# Patient Record
Sex: Female | Born: 1950 | Race: White | Hispanic: No | Marital: Single | Smoking: Former smoker
Health system: Southern US, Community
[De-identification: ages and names within clinical notes are randomized; demographics above are authoritative.]

## PROBLEM LIST (undated history)

## (undated) DIAGNOSIS — I2510 Atherosclerotic heart disease of native coronary artery without angina pectoris: Secondary | ICD-10-CM

## (undated) DIAGNOSIS — F32A Depression, unspecified: Secondary | ICD-10-CM

## (undated) DIAGNOSIS — K219 Gastro-esophageal reflux disease without esophagitis: Secondary | ICD-10-CM

## (undated) DIAGNOSIS — I493 Ventricular premature depolarization: Secondary | ICD-10-CM

## (undated) DIAGNOSIS — E039 Hypothyroidism, unspecified: Secondary | ICD-10-CM

## (undated) DIAGNOSIS — I10 Essential (primary) hypertension: Secondary | ICD-10-CM

## (undated) DIAGNOSIS — Z8711 Personal history of peptic ulcer disease: Secondary | ICD-10-CM

## (undated) HISTORY — PX: TUBAL LIGATION: SHX77

## (undated) HISTORY — DX: Hypothyroidism, unspecified: E03.9

## (undated) HISTORY — DX: Atherosclerotic heart disease of native coronary artery without angina pectoris: I25.10

## (undated) HISTORY — PX: CHOLECYSTECTOMY: SHX55

## (undated) HISTORY — DX: Personal history of peptic ulcer disease: Z87.11

## (undated) HISTORY — DX: Ventricular premature depolarization: I49.3

## (undated) HISTORY — DX: Gastro-esophageal reflux disease without esophagitis: K21.9

---

## 2019-07-21 ENCOUNTER — Emergency Department (HOSPITAL_COMMUNITY)
Admission: EM | Admit: 2019-07-21 | Discharge: 2019-07-21 | Disposition: A | Discharge status: Home / Self Care | Payer: Medicare Other | Attending: Emergency Medicine | Admitting: Emergency Medicine

## 2019-07-21 ENCOUNTER — Emergency Department (HOSPITAL_COMMUNITY): Payer: Medicare Other

## 2019-07-21 ENCOUNTER — Other Ambulatory Visit: Payer: Self-pay

## 2019-07-21 ENCOUNTER — Encounter (HOSPITAL_COMMUNITY): Payer: Self-pay | Admitting: Emergency Medicine

## 2019-07-21 DIAGNOSIS — I10 Essential (primary) hypertension: Secondary | ICD-10-CM | POA: Diagnosis not present

## 2019-07-21 DIAGNOSIS — R531 Weakness: Secondary | ICD-10-CM | POA: Insufficient documentation

## 2019-07-21 DIAGNOSIS — R5383 Other fatigue: Secondary | ICD-10-CM | POA: Diagnosis not present

## 2019-07-21 DIAGNOSIS — R0789 Other chest pain: Secondary | ICD-10-CM | POA: Insufficient documentation

## 2019-07-21 HISTORY — DX: Essential (primary) hypertension: I10

## 2019-07-21 LAB — BASIC METABOLIC PANEL
Anion gap: 10 (ref 5–15)
BUN: 17 mg/dL (ref 8–23)
CO2: 26 mmol/L (ref 22–32)
Calcium: 9.6 mg/dL (ref 8.9–10.3)
Chloride: 105 mmol/L (ref 98–111)
Creatinine, Ser: 0.83 mg/dL (ref 0.44–1.00)
GFR calc Af Amer: >60 mL/min (ref >60)
GFR calc non Af Amer: >60 mL/min (ref >60)
Glucose, Bld: 132 mg/dL — ABNORMAL HIGH (ref 70–99)
Potassium: 4.2 mmol/L (ref 3.5–5.1)
Sodium: 141 mmol/L (ref 135–145)

## 2019-07-21 LAB — LIPASE, BLOOD: Lipase: 35 U/L (ref 11–51)

## 2019-07-21 LAB — HEPATIC FUNCTION PANEL
ALT: 23 U/L (ref 0–44)
AST: 25 U/L (ref 15–41)
Albumin: 4 g/dL (ref 3.5–5.0)
Alkaline Phosphatase: 76 U/L (ref 38–126)
Bilirubin, Direct: 0.1 mg/dL (ref 0.0–0.2)
Indirect Bilirubin: 0.8 mg/dL (ref 0.3–0.9)
Total Bilirubin: 0.9 mg/dL (ref 0.3–1.2)
Total Protein: 7.2 g/dL (ref 6.5–8.1)

## 2019-07-21 LAB — CBC
HCT: 48.2 % — ABNORMAL HIGH (ref 36.0–46.0)
Hemoglobin: 15.8 g/dL — ABNORMAL HIGH (ref 12.0–15.0)
MCH: 30 pg (ref 26.0–34.0)
MCHC: 32.8 g/dL (ref 30.0–36.0)
MCV: 91.5 fL (ref 80.0–100.0)
Platelets: 175 10*3/uL (ref 150–400)
RBC: 5.27 MIL/uL — ABNORMAL HIGH (ref 3.87–5.11)
RDW: 12.8 % (ref 11.5–15.5)
WBC: 5.2 10*3/uL (ref 4.0–10.5)
nRBC: 0 % (ref 0.0–0.2)

## 2019-07-21 LAB — URINALYSIS, ROUTINE W REFLEX MICROSCOPIC
Bilirubin Urine: NEGATIVE
Glucose, UA: NEGATIVE mg/dL
Hgb urine dipstick: NEGATIVE
Ketones, ur: 5 mg/dL — AB
Leukocytes,Ua: NEGATIVE
Nitrite: NEGATIVE
Protein, ur: NEGATIVE mg/dL
Specific Gravity, Urine: 1.027 (ref 1.005–1.030)
pH: 5 (ref 5.0–8.0)

## 2019-07-21 LAB — TROPONIN I (HIGH SENSITIVITY)
Troponin I (High Sensitivity): 5 ng/L (ref <18)
Troponin I (High Sensitivity): 5 ng/L (ref <18)

## 2019-07-21 MED ORDER — SODIUM CHLORIDE 0.9% FLUSH: 3.0000 mL | Freq: Once | INTRAVENOUS | Status: DC

## 2019-07-21 NOTE — ED Triage Notes (Signed)
Pt c/o weakness for "while". Also states has some chest pains/tihgtness and nervousness. Denies n/v/d.

## 2019-07-21 NOTE — ED Provider Notes (Addendum)
[facility]-EMERGENCY DEPT Provider Note   CSN: [id number] Arrival date & time: 07/21/19  1132     History Chief Complaint  Patient presents with  . Arm Pain  . Chest Pain  . Fatigue    Natalie Cross is a 69 y.o. female.  69 year old female presents with acute onset of weakness and fatigue which began today while she was walking.  States that she had some chest discomfort as well as for several minutes.  It was not associated dyspnea or diaphoresis.  No recent fever or chills.  No cough or congestion.  No vomiting or diarrhea.  Went home and went to sleep and woke up and still felt weak which seem to be worse with ambulation.  No chest pain or pressure at that time.  Denies any new medications.  No treatment use prior to arrival.  Denies any urinary symptoms        Past Medical History:  Diagnosis Date  . Hypertension     There are no problems to display for this patient.   Past Surgical History:  Procedure Laterality Date  . CHOLECYSTECTOMY    . TUBAL LIGATION       OB History   No obstetric history on file.     No family history on file.  Social History   Tobacco Use  . Smoking status: Not on file  Substance Use Topics  . Alcohol use: Not on file  . Drug use: Not on file    Home Medications Prior to Admission medications   Not on File    Allergies    Patient has no known allergies.  Review of Systems   Review of Systems  All other systems reviewed and are negative.   Physical Exam Updated Vital Signs BP (!) 184/86   Pulse 81   Temp 98.1 F (36.7 C)   Resp 18   SpO2 100%   Physical Exam Vitals and nursing note reviewed.  Constitutional:      General: She is not in acute distress.    Appearance: Normal appearance. She is well-developed. She is not toxic-appearing.  HENT:     Head: Normocephalic and atraumatic.  Eyes:     General: Lids are normal.     Conjunctiva/sclera: Conjunctivae normal.     Pupils:  Pupils are equal, round, and reactive to light.  Neck:     Thyroid: No thyroid mass.     Trachea: No tracheal deviation.  Cardiovascular:     Rate and Rhythm: Normal rate and regular rhythm.     Heart sounds: Normal heart sounds. No murmur. No gallop.   Pulmonary:     Effort: Pulmonary effort is normal. No respiratory distress.     Breath sounds: Normal breath sounds. No stridor. No decreased breath sounds, wheezing, rhonchi or rales.  Abdominal:     General: Bowel sounds are normal. There is no distension.     Palpations: Abdomen is soft.     Tenderness: There is no abdominal tenderness. There is no rebound.  Musculoskeletal:        General: No tenderness. Normal range of motion.     Cervical back: Normal range of motion and neck supple.  Skin:    General: Skin is warm and dry.     Findings: No abrasion or rash.  Neurological:     Mental Status: She is alert and oriented to person, place, and time.     GCS: GCS eye subscore is 4. GCS  verbal subscore is 5. GCS motor subscore is 6.     Cranial Nerves: No cranial nerve deficit.     Sensory: No sensory deficit.  Psychiatric:        Speech: Speech normal.        Behavior: Behavior normal.     ED Results / Procedures / Treatments   Labs (all labs ordered are listed, but only abnormal results are displayed) Labs Reviewed  BASIC METABOLIC PANEL - Abnormal; Notable for the following components:      Result Value   Glucose, Bld 132 (*)    All other components within normal limits  CBC - Abnormal; Notable for the following components:   RBC 5.27 (*)    Hemoglobin 15.8 (*)    HCT 48.2 (*)    All other components within normal limits  URINALYSIS, ROUTINE W REFLEX MICROSCOPIC  CBG MONITORING, ED  TROPONIN I (HIGH SENSITIVITY)    EKG EKG Interpretation  Date/Time:  Tuesday July 21 2019 18:44:57 EST Ventricular Rate:  83 PR Interval:    QRS Duration: 99 QT Interval:  390 QTC Calculation: 459 R Axis:   45 Text  Interpretation: Sinus rhythm No significant change since last tracing Confirmed by Lacretia Leigh ([id number]) on 07/21/2019 6:49:22 PM   Radiology DG Chest Portable 1 View  Result Date: 07/21/2019 CLINICAL DATA:  Chest pain. EXAM: PORTABLE CHEST 1 VIEW COMPARISON:  None. FINDINGS: There is no evidence of an acute infiltrate, pleural effusion or pneumothorax. The heart size and mediastinal contours are within normal limits. Multilevel degenerative changes seen throughout the thoracic spine. IMPRESSION: No active disease. Electronically Signed   By: Virgina Norfolk M.D.   On: 07/21/2019 18:43    Procedures Procedures (including critical care time)  Medications Ordered in ED Medications  sodium chloride flush (NS) 0.9 % injection 3 mL (has no administration in time range)    ED Course  I have reviewed the triage vital signs and the nursing notes.  Pertinent labs & imaging results that were available during my care of the patient were reviewed by me and considered in my medical decision making (see chart for details).    MDM Rules/Calculators/A&P                     Patient's heart score is 3. Patient had negative delta troponin here.  Chest x-ray is negative here.  No obvious explanation for her weakness.  She is at baseline at this time and will be discharged home Final Clinical Impression(s) / ED Diagnoses Final diagnoses:  None    Rx / DC Orders ED Discharge Orders    None       Lacretia Leigh, MD 07/21/19 2258    Lacretia Leigh, MD 07/21/19 [phone]

## 2020-06-17 ENCOUNTER — Other Ambulatory Visit: Payer: Self-pay | Admitting: Family Medicine

## 2020-06-17 DIAGNOSIS — E2839 Other primary ovarian failure: Secondary | ICD-10-CM

## 2020-06-17 DIAGNOSIS — Z1231 Encounter for screening mammogram for malignant neoplasm of breast: Secondary | ICD-10-CM

## 2020-07-02 ENCOUNTER — Emergency Department (HOSPITAL_BASED_OUTPATIENT_CLINIC_OR_DEPARTMENT_OTHER)
Admission: EM | Admit: 2020-07-02 | Discharge: 2020-07-02 | Disposition: A | Discharge status: Home / Self Care | Payer: Medicare Other | Attending: Emergency Medicine | Admitting: Emergency Medicine

## 2020-07-02 ENCOUNTER — Emergency Department (HOSPITAL_BASED_OUTPATIENT_CLINIC_OR_DEPARTMENT_OTHER): Payer: Medicare Other

## 2020-07-02 ENCOUNTER — Encounter (HOSPITAL_BASED_OUTPATIENT_CLINIC_OR_DEPARTMENT_OTHER): Payer: Self-pay | Admitting: Emergency Medicine

## 2020-07-02 DIAGNOSIS — M545 Low back pain, unspecified: Secondary | ICD-10-CM | POA: Diagnosis not present

## 2020-07-02 DIAGNOSIS — R109 Unspecified abdominal pain: Secondary | ICD-10-CM | POA: Insufficient documentation

## 2020-07-02 DIAGNOSIS — I10 Essential (primary) hypertension: Secondary | ICD-10-CM | POA: Insufficient documentation

## 2020-07-02 HISTORY — DX: Depression, unspecified: F32.A

## 2020-07-02 LAB — CBC WITH DIFFERENTIAL/PLATELET
Abs Immature Granulocytes: 0.01 10*3/uL (ref 0.00–0.07)
Basophils Absolute: 0 10*3/uL (ref 0.0–0.1)
Basophils Relative: 0 %
Eosinophils Absolute: 0 10*3/uL (ref 0.0–0.5)
Eosinophils Relative: 0 %
HCT: 45.8 % (ref 36.0–46.0)
Hemoglobin: 15.2 g/dL — ABNORMAL HIGH (ref 12.0–15.0)
Immature Granulocytes: 0 %
Lymphocytes Relative: 15 %
Lymphs Abs: 0.8 10*3/uL (ref 0.7–4.0)
MCH: 30 pg (ref 26.0–34.0)
MCHC: 33.2 g/dL (ref 30.0–36.0)
MCV: 90.5 fL (ref 80.0–100.0)
Monocytes Absolute: 0.3 10*3/uL (ref 0.1–1.0)
Monocytes Relative: 5 %
Neutro Abs: 4.1 10*3/uL (ref 1.7–7.7)
Neutrophils Relative %: 80 %
Platelets: 165 10*3/uL (ref 150–400)
RBC: 5.06 MIL/uL (ref 3.87–5.11)
RDW: 12.8 % (ref 11.5–15.5)
WBC: 5.1 10*3/uL (ref 4.0–10.5)
nRBC: 0 % (ref 0.0–0.2)

## 2020-07-02 LAB — COMPREHENSIVE METABOLIC PANEL
ALT: 17 U/L (ref 0–44)
AST: 22 U/L (ref 15–41)
Albumin: 4.3 g/dL (ref 3.5–5.0)
Alkaline Phosphatase: 62 U/L (ref 38–126)
Anion gap: 12 (ref 5–15)
BUN: 16 mg/dL (ref 8–23)
CO2: 25 mmol/L (ref 22–32)
Calcium: 9.3 mg/dL (ref 8.9–10.3)
Chloride: 103 mmol/L (ref 98–111)
Creatinine, Ser: 0.9 mg/dL (ref 0.44–1.00)
GFR, Estimated: >60 mL/min (ref >60)
Glucose, Bld: 124 mg/dL — ABNORMAL HIGH (ref 70–99)
Potassium: 3.5 mmol/L (ref 3.5–5.1)
Sodium: 140 mmol/L (ref 135–145)
Total Bilirubin: 0.6 mg/dL (ref 0.3–1.2)
Total Protein: 7 g/dL (ref 6.5–8.1)

## 2020-07-02 LAB — URINALYSIS, ROUTINE W REFLEX MICROSCOPIC
Bilirubin Urine: NEGATIVE
Glucose, UA: NEGATIVE mg/dL
Hgb urine dipstick: NEGATIVE
Ketones, ur: NEGATIVE mg/dL
Leukocytes,Ua: NEGATIVE
Nitrite: NEGATIVE
Protein, ur: NEGATIVE mg/dL
Specific Gravity, Urine: 1.02 (ref 1.005–1.030)
pH: 5 (ref 5.0–8.0)

## 2020-07-02 LAB — LIPASE, BLOOD: Lipase: 55 U/L — ABNORMAL HIGH (ref 11–51)

## 2020-07-02 MED ORDER — IOHEXOL 300 MG/ML  SOLN
100.0000 mL | Freq: Once | INTRAMUSCULAR | Status: AC | PRN
  Administered 2020-07-02: 100 mL via INTRAVENOUS

## 2020-07-02 MED ORDER — ONDANSETRON HCL 4 MG/2ML IJ SOLN
4.0000 mg | Freq: Once | INTRAMUSCULAR | Status: AC
  Administered 2020-07-02: 4 mg via INTRAVENOUS
  Filled 2020-07-02: qty 2

## 2020-07-02 MED ORDER — HYDROMORPHONE HCL 1 MG/ML IJ SOLN
1.0000 mg | Freq: Once | INTRAMUSCULAR | Status: AC
  Administered 2020-07-02: 1 mg via INTRAVENOUS
  Filled 2020-07-02: qty 1

## 2020-07-02 MED ORDER — DIAZEPAM 2 MG PO TABS
2.0000 mg | ORAL_TABLET | Freq: Once | ORAL | Status: AC
  Administered 2020-07-02: 2 mg via ORAL
  Filled 2020-07-02: qty 1

## 2020-07-02 MED ORDER — OXYCODONE-ACETAMINOPHEN 5-325 MG PO TABS: 1.0000 | ORAL_TABLET | Freq: Four times a day (QID) | ORAL | 0 refills | Status: DC | PRN

## 2020-07-02 MED ORDER — KETOROLAC TROMETHAMINE 30 MG/ML IJ SOLN
15.0000 mg | Freq: Once | INTRAMUSCULAR | Status: AC
  Administered 2020-07-02: 15 mg via INTRAVENOUS
  Filled 2020-07-02: qty 1

## 2020-07-02 MED ORDER — DIAZEPAM 2 MG PO TABS: 2.0000 mg | ORAL_TABLET | Freq: Three times a day (TID) | ORAL | 0 refills | Status: DC | PRN

## 2020-07-02 NOTE — ED Provider Notes (Signed)
[facility] EMERGENCY DEPARTMENT Provider Note   CSN: [id number] Arrival date & time: 07/02/20  1002     History Chief Complaint  Patient presents with  . Back Pain    Natalie Cross is a 70 y.o. female.  She is complaining of severe low back pain that started around 7 PM last night.  Occurred when she stood to get out of a chair.  No fall.  Associated with severe spasms.  Today she needed to move her bowels and she was afraid to get out of bed so she stooled herself.  Not associate with any numbness or weakness.  Called EMS and had to crawl to the door to open it.  She also states she has had digestive problems since she had gallstone pancreatitis 10 years ago.  She wonders if this pain in her back is related to her pancreatitis.  Still having a little bit of abdominal pain but more back pain.  She is currently not on any pain medicines.  History of back problems.  When the pain was severe she said she was diaphoretic.  The history is provided by the patient.  Back Pain Location:  Lumbar spine Quality:  Stabbing Pain severity:  Severe Pain is:  Same all the time Onset quality:  Sudden Duration:  16 hours Timing:  Constant Progression:  Unchanged Chronicity:  Recurrent Context: not falling and not recent injury   Relieved by:  Nothing Worsened by:  Bending, movement and twisting Ineffective treatments:  Being still and heating pad Associated symptoms: abdominal pain   Associated symptoms: no bladder incontinence, no bowel incontinence, no chest pain, no dysuria, no fever, no leg pain, no numbness and no weakness        Past Medical History:  Diagnosis Date  . Depression   . Hypertension     There are no problems to display for this patient.   Past Surgical History:  Procedure Laterality Date  . CHOLECYSTECTOMY    . TUBAL LIGATION       OB History   No obstetric history on file.     History reviewed. No pertinent family history.  Social History    Tobacco Use  . Smoking status: Never Smoker  . Smokeless tobacco: Never Used  Substance Use Topics  . Alcohol use: Not Currently    Home Medications Prior to Admission medications   Medication Sig Start Date End Date Taking? Authorizing Provider  Multiple Vitamin (MULTIVITAMIN) tablet Take 1 tablet by mouth daily.    [provider]    Allergies    Codeine  Review of Systems   Review of Systems  Constitutional: Positive for diaphoresis. Negative for fever.  HENT: Negative for sore throat.   Eyes: Negative for visual disturbance.  Respiratory: Negative for shortness of breath.   Cardiovascular: Negative for chest pain.  Gastrointestinal: Positive for abdominal pain. Negative for bowel incontinence and vomiting.  Genitourinary: Negative for bladder incontinence and dysuria.  Musculoskeletal: Positive for back pain and gait problem.  Skin: Negative for rash.  Neurological: Negative for weakness and numbness.    Physical Exam Updated Vital Signs BP (!) 164/81 (BP Location: Left Arm)   Pulse 76   Temp 98.4 F (36.9 C) (Oral)   Resp 18   Ht 5\' 6"  (1.676 m)   Wt 77.1 kg   SpO2 100%   BMI 27.44 kg/m   Physical Exam Vitals and nursing note reviewed.  Constitutional:      General: She is  not in acute distress.    Appearance: Normal appearance. She is well-developed and well-nourished.  HENT:     Head: Normocephalic and atraumatic.  Eyes:     Conjunctiva/sclera: Conjunctivae normal.  Cardiovascular:     Rate and Rhythm: Normal rate and regular rhythm.     Pulses: Normal pulses.     Heart sounds: No murmur heard.   Pulmonary:     Effort: Pulmonary effort is normal. No respiratory distress.     Breath sounds: Normal breath sounds.  Abdominal:     Palpations: Abdomen is soft.     Tenderness: There is no abdominal tenderness. There is no guarding or rebound.  Musculoskeletal:        General: Tenderness present. No edema.     Cervical back: Neck supple.      Comments: She is tender in her lumbar and paralumbar area.  Skin:    General: Skin is warm and dry.     Capillary Refill: Capillary refill takes less than 2 seconds.  Neurological:     General: No focal deficit present.     Mental Status: She is alert.     Sensory: No sensory deficit.     Motor: No weakness.     Gait: Gait abnormal (She was able to transfer from wheelchair to toilet with some assistance.  Pain seems to be limiting her more than true weakness.).  Psychiatric:        Mood and Affect: Mood and affect normal.     ED Results / Procedures / Treatments   Labs (all labs ordered are listed, but only abnormal results are displayed) Labs Reviewed  COMPREHENSIVE METABOLIC PANEL - Abnormal; Notable for the following components:      Result Value   Glucose, Bld 124 (*)    All other components within normal limits  LIPASE, BLOOD - Abnormal; Notable for the following components:   Lipase 55 (*)    All other components within normal limits  CBC WITH DIFFERENTIAL/PLATELET - Abnormal; Notable for the following components:   Hemoglobin 15.2 (*)    All other components within normal limits  URINALYSIS, ROUTINE W REFLEX MICROSCOPIC    EKG None  Radiology CT Abdomen Pelvis W Contrast  Result Date: 07/02/2020 CLINICAL DATA:  Back pain EXAM: CT ABDOMEN AND PELVIS WITH CONTRAST TECHNIQUE: Multidetector CT imaging of the abdomen and pelvis was performed using the standard protocol following bolus administration of intravenous contrast. CONTRAST:  161mL OMNIPAQUE IOHEXOL 300 MG/ML  SOLN COMPARISON:  None. FINDINGS: Lower chest: No acute abnormality. Hepatobiliary: Numerous hypodensities scattered throughout the liver, the largest in the left hepatic lobe measuring 2 cm most compatible with cysts. Prior cholecystectomy. Pancreas: No focal abnormality or ductal dilatation. Spleen: No focal abnormality.  Normal size. Adrenals/Urinary Tract: No adrenal abnormality. No focal renal  abnormality. No stones or hydronephrosis. Urinary bladder is unremarkable. Stomach/Bowel: Sigmoid diverticulosis. No active diverticulitis. Normal appendix. Stomach and small bowel decompressed, unremarkable. Vascular/Lymphatic: Aortic atherosclerosis. No evidence of aneurysm or adenopathy. Reproductive: Uterus and adnexa unremarkable.  No mass. Other: No free fluid or free air. Musculoskeletal: Mild degenerative disc and facet disease in the lower lumbar spine. No acute bony abnormality. IMPRESSION: No acute findings in the abdomen or pelvis. Sigmoid diverticulosis. Aortic atherosclerosis. Electronically Signed   By: Rolm Baptise M.D.   On: 07/02/2020 13:23   CT L-SPINE NO CHARGE  Result Date: 07/02/2020 CLINICAL DATA:  70 year old female with severe back pain since last night, diarrhea. EXAM: CT LUMBAR SPINE  WITH CONTRAST TECHNIQUE: Technique: Multiplanar CT images of the lumbar spine were reconstructed from contemporary CT of the Abdomen and Pelvis. CONTRAST:  No additional COMPARISON:  CT Chest, Abdomen, and Pelvis today are reported separately. FINDINGS: Segmentation: Normal. Alignment: Mild straightening of lumbar lordosis. Subtle dextroconvex lumbar scoliosis. No spondylolisthesis. Vertebrae: Osteopenia. No acute osseous abnormality identified. Intact visible sacrum and SI joints. Paraspinal and other soft tissues: Abdomen and pelvic viscera reported separately today. Negative paraspinal soft tissues. Disc levels: Capacious lower thoracic and lumbar spinal canal. T11-T12: Negative. T12-L1:  Negative. L1-L2:  Negative. L2-L3: Mild disc space loss, circumferential disc bulge. Mild endplate spurring. No significant stenosis. L3-L4: Mild circumferential disc bulge and endplate spurring. Borderline to mild facet hypertrophy. No convincing stenosis. L4-L5: Disc space loss. Circumferential disc bulge and endplate spurring eccentric to the right. Right greater than left mild to moderate facet hypertrophy. No  spinal stenosis. Mild right lateral recess stenosis is possible (right L5 nerve level). No convincing foraminal stenosis. L5-S1: Disc space loss. Mild circumferential disc bulge and endplate spurring. Mild to moderate facet hypertrophy greater on the right. No convincing stenosis. IMPRESSION: 1. Osteopenia. No acute osseous abnormality in the lumbar spine. 2. 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 With possible mild right lateral recess stenosis at L4-L5 (right L5 nerve level), but no spinal stenosis. 3. CT Abdomen and Pelvis today reported separately. Electronically Signed   By: Odessa Fleming M.D.   On: 07/02/2020 13:23    Procedures Procedures (including critical care time)  Medications Ordered in ED Medications  HYDROmorphone (DILAUDID) injection 1 mg (1 mg Intravenous Given 07/02/20 1102)  ondansetron (ZOFRAN) injection 4 mg (4 mg Intravenous Given 07/02/20 1101)  iohexol (OMNIPAQUE) 300 MG/ML solution 100 mL (100 mLs Intravenous Contrast Given 07/02/20 1256)  ketorolac (TORADOL) 30 MG/ML injection 15 mg (15 mg Intravenous Given 07/02/20 1405)  diazepam (VALIUM) tablet 2 mg (2 mg Oral Given 07/02/20 1405)    ED Course  I have reviewed the triage vital signs and the nursing notes.  Pertinent labs & imaging results that were available during my care of the patient were reviewed by me and considered in my medical decision making (see chart for details).  Clinical Course as of 07/02/20 1726  Sat Jul 02, 2020  1342 Patient's imaging of her abdomen and pelvis and her lumbar spine did not show any critical findings.  She says her pain is better after the medication.  We will try her on some oral spasm medicine and some Toradol now and see if she can mobilize. [MB]  1414 Patient ambulated in the department slowly but steadily. [MB]    Clinical Course User Index [MB] Terrilee Files, MD   MDM Rules/Calculators/A&P                         This patient complains of acute low back pain, more chronic  intermittent abdominal pain.; this involves an extensive number of treatment Options and is a complaint that carries with it a high risk of complications and Morbidity. The differential includes musculoskeletal pain, fracture, radiculopathy, pancreatitis, AAA, aortic dissection, gastric perforation, retroperitoneal bleed  I ordered, reviewed and interpreted labs, which included CBC with normal white count normal hemoglobin, chemistries normal other than mildly elevated glucose, urinalysis negative, lipase just mildly elevated and nonspecific I ordered medication IV pain medicine, oral diazepam for spasms I ordered imaging studies which included CT abdomen pelvis and CT lumbar spine and I independently  visualized and interpreted imaging which showed no acute findings. Does have some arthritis changes and some foraminal narrowing Previous records obtained and reviewed in epic, no recent visits  After the interventions stated above, I reevaluated the patient and found patient's pain to be adequately controlled and she is ambulatory in the department with much improvement. She is comfortable plan for outpatient follow-up with PCP and pain control. Return instructions discussed.   Final Clinical Impression(s) / ED Diagnoses Final diagnoses:  Low back pain    Rx / DC Orders ED Discharge Orders         Ordered    oxyCODONE-acetaminophen (PERCOCET/ROXICET) 5-325 MG tablet  Every 6 hours PRN        07/02/20 1421    diazepam (VALIUM) 2 MG tablet  Every 8 hours PRN        07/02/20 1421           Terrilee Files, MD 07/02/20 1728

## 2020-07-02 NOTE — ED Notes (Signed)
Pt on auto vs

## 2020-07-02 NOTE — ED Triage Notes (Signed)
Arrived via [facility], with c/o of lower back pain since last night. Hx of same, VS WNL, denies recent injury

## 2020-07-02 NOTE — Discharge Instructions (Addendum)
You were seen in the emergency department for low back pain and spasms.  You had blood work urinalysis CAT scan of your abdomen and pelvis and lumbar spine that did not show any serious findings.  Your lumbar spine did have some arthritis changes.  This will need to be followed up with your primary care doctor.  We are putting you on some pain medication and spasm medication to help with your symptoms.  Return to the emergency department if any high fever or other concerning symptoms

## 2020-07-09 ENCOUNTER — Ambulatory Visit: Payer: Medicare Other | Attending: Internal Medicine

## 2020-07-09 DIAGNOSIS — Z23 Encounter for immunization: Secondary | ICD-10-CM

## 2020-07-09 NOTE — Progress Notes (Signed)
   Covid-19 Vaccination Clinic  Name:  Natalie Cross    MRN: [id number] DOB: Apr 18, 1951  07/09/2020  [name] was observed post Covid-19 immunization for 15 minutes without incident. She was provided with Vaccine Information Sheet and instruction to access the V-Safe system.   [name] was instructed to call 911 with any severe reactions post vaccine: Marland Kitchen Difficulty breathing  . Swelling of face and throat  . A fast heartbeat  . A bad rash all over body  . Dizziness and weakness   Immunizations Administered    Name Date Dose VIS Date Route   Moderna Covid-19 Booster Vaccine 07/09/2020 10:55 AM 0.25 mL 04/20/2020 Intramuscular   Manufacturer: Moderna   Lot: [id number]   NDC: 32992-426-83

## 2020-09-27 ENCOUNTER — Ambulatory Visit
Admission: RE | Admit: 2020-09-27 | Discharge: 2020-09-27 | Disposition: A | Discharge status: Home / Self Care | Payer: Medicare (Managed Care) | Source: Ambulatory Visit | Attending: Family Medicine | Admitting: Family Medicine

## 2020-09-27 ENCOUNTER — Other Ambulatory Visit: Payer: Self-pay

## 2020-09-27 DIAGNOSIS — E2839 Other primary ovarian failure: Secondary | ICD-10-CM

## 2020-09-27 DIAGNOSIS — Z1231 Encounter for screening mammogram for malignant neoplasm of breast: Secondary | ICD-10-CM

## 2020-09-30 ENCOUNTER — Other Ambulatory Visit: Payer: Self-pay | Admitting: Family Medicine

## 2020-09-30 DIAGNOSIS — R928 Other abnormal and inconclusive findings on diagnostic imaging of breast: Secondary | ICD-10-CM

## 2020-10-21 ENCOUNTER — Other Ambulatory Visit: Payer: Self-pay

## 2020-10-21 ENCOUNTER — Ambulatory Visit
Admission: RE | Admit: 2020-10-21 | Discharge: 2020-10-21 | Disposition: A | Discharge status: Home / Self Care | Payer: Medicare (Managed Care) | Source: Ambulatory Visit | Attending: Family Medicine | Admitting: Family Medicine

## 2020-10-21 ENCOUNTER — Ambulatory Visit: Payer: Medicare (Managed Care)

## 2020-10-21 DIAGNOSIS — R928 Other abnormal and inconclusive findings on diagnostic imaging of breast: Secondary | ICD-10-CM

## 2021-08-01 ENCOUNTER — Other Ambulatory Visit (HOSPITAL_COMMUNITY): Payer: Self-pay | Admitting: Family Medicine

## 2021-08-11 ENCOUNTER — Ambulatory Visit (HOSPITAL_COMMUNITY)
Admission: RE | Admit: 2021-08-11 | Discharge: 2021-08-11 | Disposition: A | Discharge status: Home / Self Care | Payer: Medicare Other | Source: Ambulatory Visit | Attending: Family Medicine | Admitting: Family Medicine

## 2021-08-11 ENCOUNTER — Other Ambulatory Visit: Payer: Self-pay

## 2021-08-11 DIAGNOSIS — E7800 Pure hypercholesterolemia, unspecified: Secondary | ICD-10-CM | POA: Insufficient documentation

## 2021-08-11 DIAGNOSIS — Z136 Encounter for screening for cardiovascular disorders: Secondary | ICD-10-CM | POA: Insufficient documentation

## 2021-09-03 ENCOUNTER — Other Ambulatory Visit: Payer: Self-pay

## 2021-09-03 ENCOUNTER — Emergency Department (HOSPITAL_COMMUNITY): Payer: Medicare Other

## 2021-09-03 ENCOUNTER — Emergency Department (HOSPITAL_COMMUNITY)
Admission: EM | Admit: 2021-09-03 | Discharge: 2021-09-03 | Disposition: A | Discharge status: Home / Self Care | Payer: Medicare Other | Attending: Emergency Medicine | Admitting: Emergency Medicine

## 2021-09-03 ENCOUNTER — Encounter (HOSPITAL_COMMUNITY): Payer: Self-pay

## 2021-09-03 DIAGNOSIS — F419 Anxiety disorder, unspecified: Secondary | ICD-10-CM | POA: Insufficient documentation

## 2021-09-03 DIAGNOSIS — R5383 Other fatigue: Secondary | ICD-10-CM | POA: Diagnosis not present

## 2021-09-03 DIAGNOSIS — I10 Essential (primary) hypertension: Secondary | ICD-10-CM | POA: Diagnosis not present

## 2021-09-03 DIAGNOSIS — Z7982 Long term (current) use of aspirin: Secondary | ICD-10-CM | POA: Diagnosis not present

## 2021-09-03 DIAGNOSIS — R000 Tachycardia, unspecified: Secondary | ICD-10-CM | POA: Diagnosis not present

## 2021-09-03 DIAGNOSIS — R002 Palpitations: Secondary | ICD-10-CM | POA: Diagnosis not present

## 2021-09-03 LAB — CBC WITH DIFFERENTIAL/PLATELET
Abs Immature Granulocytes: 0.01 10*3/uL (ref 0.00–0.07)
Basophils Absolute: 0 10*3/uL (ref 0.0–0.1)
Basophils Relative: 1 %
Eosinophils Absolute: 0 10*3/uL (ref 0.0–0.5)
Eosinophils Relative: 0 %
HCT: 44.8 % (ref 36.0–46.0)
Hemoglobin: 14.9 g/dL (ref 12.0–15.0)
Immature Granulocytes: 0 %
Lymphocytes Relative: 30 %
Lymphs Abs: 1.4 10*3/uL (ref 0.7–4.0)
MCH: 30.3 pg (ref 26.0–34.0)
MCHC: 33.3 g/dL (ref 30.0–36.0)
MCV: 91.1 fL (ref 80.0–100.0)
Monocytes Absolute: 0.4 10*3/uL (ref 0.1–1.0)
Monocytes Relative: 8 %
Neutro Abs: 2.7 10*3/uL (ref 1.7–7.7)
Neutrophils Relative %: 61 %
Platelets: 157 10*3/uL (ref 150–400)
RBC: 4.92 MIL/uL (ref 3.87–5.11)
RDW: 12.9 % (ref 11.5–15.5)
WBC: 4.5 10*3/uL (ref 4.0–10.5)
nRBC: 0 % (ref 0.0–0.2)

## 2021-09-03 LAB — COMPREHENSIVE METABOLIC PANEL
ALT: 17 U/L (ref 0–44)
AST: 28 U/L (ref 15–41)
Albumin: 3.9 g/dL (ref 3.5–5.0)
Alkaline Phosphatase: 58 U/L (ref 38–126)
Anion gap: 10 (ref 5–15)
BUN: 10 mg/dL (ref 8–23)
CO2: 25 mmol/L (ref 22–32)
Calcium: 9.4 mg/dL (ref 8.9–10.3)
Chloride: 106 mmol/L (ref 98–111)
Creatinine, Ser: 0.93 mg/dL (ref 0.44–1.00)
GFR, Estimated: >60 mL/min (ref >60)
Glucose, Bld: 119 mg/dL — ABNORMAL HIGH (ref 70–99)
Potassium: 3.9 mmol/L (ref 3.5–5.1)
Sodium: 141 mmol/L (ref 135–145)
Total Bilirubin: 0.5 mg/dL (ref 0.3–1.2)
Total Protein: 6.9 g/dL (ref 6.5–8.1)

## 2021-09-03 LAB — TROPONIN I (HIGH SENSITIVITY): Troponin I (High Sensitivity): 4 ng/L (ref <18)

## 2021-09-03 LAB — T4, FREE: Free T4: 1.05 ng/dL (ref 0.61–1.12)

## 2021-09-03 LAB — MAGNESIUM: Magnesium: 2.2 mg/dL (ref 1.7–2.4)

## 2021-09-03 LAB — TSH: TSH: 7.323 u[IU]/mL — ABNORMAL HIGH (ref 0.350–4.500)

## 2021-09-03 MED ORDER — METOPROLOL SUCCINATE ER 25 MG PO TB24: 25.0000 mg | ORAL_TABLET | Freq: Every day | ORAL | 0 refills | Status: DC

## 2021-09-03 NOTE — Discharge Instructions (Signed)
Your EKG revealed premature ventricular contractions ?Your blood counts were great, your electrolytes were within normal range, your TSH (thyroid-stimulating hormone) was elevated.   ?You may follow-up with your primary care physician to continue the work-up for possible hypothyroidism. ?In the interim, we will give you a prescription for daily metoprolol to see if this helps with your intermittent palpitations. ?Continue staying hydrated, if your symptoms change or worsen please return to ED for evaluation  ?If you wish to follow-up with cardiology, please call [phone] to establish with [place] [facility]. ?

## 2021-09-03 NOTE — ED Provider Notes (Signed)
[facility] EM[facility]ENC[facility] DEPARTMENT Provider Note  History   Chief Complaint  Patient presents with   Tachycardia   Natalie Cross is a 71 y.o. female w/ h/o HTN, depression who p/w concern for intermittent palpitations.   The history is provided by the patient.  Illness Location:  Central chest Quality:  Palpitations (sensation of heart beating fast/skipping a beat) Severity:  Moderate Onset quality:  Sudden Duration: Usually brief, sustained for "a while" on Friday (2 days ago), unable to provide more specific details. Timing:  Intermittent Progression:  Worsening Chronicity:  Recurrent Context:  See MDM Associated symptoms: fatigue (Ongoing, chronic)   Associated symptoms: no abdominal pain, no chest pain, no congestion, no cough, no diarrhea, no fever, no headaches, no nausea, no rash, no shortness of breath, no sore throat and no vomiting    Past Medical History:  Diagnosis Date   Depression    Hypertension     Social History   Tobacco Use   Smoking status: Never   Smokeless tobacco: Never  Substance Use Topics   Alcohol use: Not Currently     History reviewed. No pertinent family history.  Review of Systems  Constitutional:  Positive for fatigue (Ongoing, chronic). Negative for appetite change, chills and fever.  HENT:  Negative for congestion and sore throat.   Eyes:  Negative for photophobia and visual disturbance.  Respiratory:  Negative for cough and shortness of breath.   Cardiovascular:  Negative for chest pain and palpitations.  Gastrointestinal:  Negative for abdominal pain, blood in stool, constipation, diarrhea, nausea and vomiting.  Endocrine: Negative.   Genitourinary:  Negative for difficulty urinating, dysuria and hematuria.  Musculoskeletal:  Negative for neck pain and neck stiffness.  Skin:  Negative for rash and wound.  Allergic/Immunologic: Negative.   Neurological:  Negative for dizziness, seizures, syncope, facial  asymmetry, speech difficulty, numbness and headaches.  Hematological: Negative.   Psychiatric/Behavioral: Negative.    All other systems reviewed and are negative.   Physical Exam   Today's Vitals   09/03/21 0930 09/03/21 1000 09/03/21 1031 09/03/21 1039  BP: (!) 155/77 139/83 (!) 172/81   Pulse: 71 70 76   Resp: 17 14 20    Temp:   98.6 F (37 C)   TempSrc:   Oral   SpO2: 96% 95% 98%   Weight:      Height:      PainSc:    0-No pain     Physical Exam Vitals reviewed.  Constitutional:      General: She is not in acute distress.    Appearance: Normal appearance. She is not ill-appearing or toxic-appearing.  HENT:     Head: Normocephalic and atraumatic.     Nose: Nose normal. No congestion.     Mouth/Throat:     Mouth: Mucous membranes are moist.     Pharynx: Oropharynx is clear. No oropharyngeal exudate.  Eyes:     Extraocular Movements: Extraocular movements intact.     Conjunctiva/sclera: Conjunctivae normal.     Pupils: Pupils are equal, round, and reactive to light.  Cardiovascular:     Rate and Rhythm: Normal rate and regular rhythm.     Pulses: Normal pulses.     Heart sounds: Normal heart sounds. No murmur heard. Pulmonary:     Effort: Pulmonary effort is normal. No respiratory distress.     Breath sounds: Normal breath sounds. No stridor. No wheezing, rhonchi or rales.  Chest:     Chest wall: No tenderness.  Abdominal:     General: Abdomen is flat.     Tenderness: There is no abdominal tenderness. There is no right CVA tenderness, left CVA tenderness, guarding or rebound.  Musculoskeletal:        General: Normal range of motion.     Cervical back: Normal range of motion and neck supple. No tenderness.     Right lower leg: No edema.     Left lower leg: No edema.  Skin:    General: Skin is warm and dry.     Capillary Refill: Capillary refill takes less than 2 seconds.     Findings: No rash.  Neurological:     General: No focal deficit present.     Mental  Status: She is alert and oriented to person, place, and time. Mental status is at baseline.     Cranial Nerves: No cranial nerve deficit.     Sensory: No sensory deficit.     Motor: No weakness.     Coordination: Coordination normal.  Psychiatric:     Comments: Anxious appearing, improving with reassurance at bedside.  Behavior normal.    ED Course  Procedures  Medical Decision Making:  Natalie Cross is a 71 y.o. female w/ h/o HTN, depression who p/w concern for intermittent palpitations.   Patient states for the last 2 months she has noticed intermittent palpitations (sensation of heart skipping a beat), worse in the evening when the patient is laying down.  Initially did not occur every evening, but over the last few days has occurred every night.  2 days ago, the patient was leaning forward to retrieve something from the fridge when she developed lightheaded sensation, she assisted herself to the floor, did not hit her head, did not fall, did not syncopized, and then noted the sensation of her heart skipping a beat, she states this was sustained and her heart rate "felt fast."  This is the only instance the patient reported of noticing this during the day.  She denies chest pain, shortness of breath, abdominal pain, GI losses, syncope, sustained lightheadedness or dizziness, trauma, sick contacts, dysuria, hematuria, blood in stool, or preceding infectious symptoms.  The patient reports regular p.o. intake.  This morning, the patient awoke feeling like her "heart was beating fast" and noted a "gurgling sensation in her stomach."  The patient states "I know that a heart attack can present differently in women, this is my primary concern and why called EMS."  She states now that she feels a little silly, may just be overreacting, knows that this causes her anxiety which "certainly makes things worse."  The patient states her only medications are lisinopril for hypertension and fluoxetine for  depression, and she has been compliant with these medications.  She states she has been evaluated for this concern with PCP, and has a follow-up PCP appointment for repeat labs but that "her anxiety this morning "made her feel like she needed to be evaluated today "just in case something was wrong."  Denies history of atrial fibrillation, atrial flutter, SVT, or history of ablation Denies anticoagulation Has never been seen by cardiology Has never been evaluated with a Holter monitor Denies recreational drug use (to include cocaine, EtOH, tobacco) Does not consume significant caffeine, occasionally drinks a cup of coffee Denies preceding infectious symptoms, denies fever  On examination: Vital signs stable, heart rate on the monitor reveals intermittent PVCs, lungs clear to auscultation, heart rate regular rate, regular rhythm with the exception of intermittent PVCs,  no murmur, no JVD, no pitting edema in lower extremities, abdomen soft/nontender, nonfocal neurologic examination.  To obtain CXR, EKG, basic labs to evaluate for possible causes of PVCs.  ER provider interpretation of Imaging / Radiology:  CXR: No acute cardiopulmonary abnormality  ER provider interpretation of EKG: Rate 78, QTc 441, PR 147, QRS 113, no STEMI, no ST depressions, comparison showed new PVCs.   ER provider interpretation of Labs:  CBC: WBC 4.5, Hgb 14.9 CMP: Na 141, K 3.9, BG 119, no AKI, no elevated LFTs Mg: 2.2 TSH: 7.323 Troponin: 4  Key medications administered in the ER:  Medications - No data to display  Diagnoses considered: Etiology of PVCs: Unknown at this time, may relate to thyroid dysfunction.  Differential includes narrow complex tachycardia (sinus tachycardia, atrial flutter, atrial fibrillation, multifocal atrial tachycardia), wide complex tachycardia (VT, aberrancy/LBBB), intermittent arrhythmias) PVCs, PACs, nonsustained VT), valvular heart disease (mitral/aortic insufficiency, mitral valve  prolapse), psychiatric (panic attack, anxiety, depression), drug/medication use (caffeine, amphetamines, cocaine, aspirin overdose, EtOH/opioid withdrawal), or other medical conditions (thyrotoxicosis, anemia, addisonian crisis, pheochromocytoma).  No concern for ACS as EKG unremarkable and troponin unremarkable.  Consulted: Not indicated  On reassessment, patient feels reassured and less anxious.  No new complaints at this time.  Patient updated of the above findings.  We will order free T4, patient will be discharged to follow-up with PCP for further work-up of her thyroid dysfunction.  In the interim, we will give her a short prescription for daily metoprolol as a trial to see if this will assist with palpitations.  All questions were answered at time of discharge.  Patient was discharged in stable condition.  Patient seen in conjunction with Dr. Duayne Cal medical dictation software was used in the creation of this note.   Electronically signed by: Wynetta Fines, MD on 09/03/2021 at 3:35 PM  Clinical Impression:  1. Palpitations     Dispo: Discharge    Wynetta Fines, MD 09/03/21 1538    Davonna Belling, MD 09/03/21 [phone]

## 2021-09-03 NOTE — ED Notes (Signed)
Patient transported to X-ray 

## 2021-09-03 NOTE — ED Triage Notes (Signed)
Pt BIB [facility] from home c/o feeling like she had a rapid HR. Per EMS she is having PVCs and is suppose to go see a doctor this week. Pt denies any pain at this time, but states she is scared.  ?

## 2021-09-20 ENCOUNTER — Ambulatory Visit: Payer: Medicare Other | Admitting: Cardiology

## 2021-09-20 ENCOUNTER — Encounter: Payer: Self-pay | Admitting: Cardiology

## 2021-09-20 ENCOUNTER — Inpatient Hospital Stay: Payer: Medicare Other

## 2021-09-20 ENCOUNTER — Other Ambulatory Visit: Payer: Self-pay

## 2021-09-20 VITALS — BP 130/76 | HR 72 | Temp 97.9°F | Resp 16 | Ht 65.0 in | Wt 175.0 lb

## 2021-09-20 DIAGNOSIS — I493 Ventricular premature depolarization: Secondary | ICD-10-CM

## 2021-09-20 DIAGNOSIS — R931 Abnormal findings on diagnostic imaging of heart and coronary circulation: Secondary | ICD-10-CM

## 2021-09-20 DIAGNOSIS — I10 Essential (primary) hypertension: Secondary | ICD-10-CM

## 2021-09-20 DIAGNOSIS — E039 Hypothyroidism, unspecified: Secondary | ICD-10-CM

## 2021-09-20 DIAGNOSIS — E7800 Pure hypercholesterolemia, unspecified: Secondary | ICD-10-CM

## 2021-09-20 DIAGNOSIS — I2510 Atherosclerotic heart disease of native coronary artery without angina pectoris: Secondary | ICD-10-CM

## 2021-09-20 MED ORDER — ATORVASTATIN CALCIUM 20 MG PO TABS
20.0000 mg | ORAL_TABLET | Freq: Every day | ORAL | 0 refills | Status: DC
Start: 2021-09-20 — End: 2021-12-18

## 2021-09-20 MED ORDER — ASPIRIN EC 81 MG PO TBEC
81.0000 mg | DELAYED_RELEASE_TABLET | Freq: Every day | ORAL | 11 refills | Status: DC
Start: 2021-09-20 — End: 2022-09-07

## 2021-09-20 NOTE — Progress Notes (Signed)
? ?Date:  09/20/2021  ? ?ID:  Natalie Cross, DOB 04-15-1951, MRN [id number] ? ?PCP:  Shon Hale, MD  ?Cardiologist:  Tessa Lerner, DO, [facility] (established care 09/20/2021) ? ?REASON FOR CONSULT: PVCs ? ?REQUESTING PHYSICIAN:  ?Shon Hale, MD ?[address] ?[place],  [place] [address] ? ?Chief Complaint  ?Patient presents with  ? Ventricular premature contractions  ? New Patient (Initial Visit)  ? ? ?HPI  ?Natalie Cross is a 71 y.o. Caucasian female whose past medical history and cardiovascular risk factors include: Benign essential hypertension, hyperlipidemia, Severe coronary artery calcification, hypothyroidism, history of stomach ulcers, PVCs, postmenopausal female, advanced age. ? ?She is referred to the office at the request of Shon Hale, * for evaluation of premature ventricular contractions. ? ?Patient presents to the office for evaluation and management of premature ventricular contractions/palpitations.  She recently had gone to [facility] emergency room department on September 03, 2020 due to a rapid heart rate via EMS.  She was found to have normal sinus rhythm with frequent PVCs and placed on metoprolol.  Her symptoms have improved since initiation of metoprolol.  She followed up with PCP and now referred to cardiology for further evaluation and management. ? ?Initially it was thought that the underlying cause was possibly her thyroid function; however, this was rechecked and her TSH is within normal limits. ? ?After initiation of metoprolol patient states that her symptoms of palpitations/skipped beats have improved by approximately 50%.  She is still noticeable when she is resting at night and when she thinks more about it anxiety kicks in and symptoms are exacerbated.  She denies any near-syncope or syncopal events.  She thinks it may be secondary to heartburn/GERD and also has started consuming small and frequent meals which has helped.  She is already on Protonix  daily. ? ?Patient very seldomly drinks caffeinated beverages such as tea and coffee.  She denies a consumption of alcohol, recreational drugs, energy drinks, stimulants, weight loss supplements. ? ?FUNCTIONAL STATUS: ?Goes out for a walk every other day for 1 hour. ? ?ALLERGIES: ?Allergies  ?Allergen Reactions  ? Codeine Nausea And Vomiting  ? ? ?MEDICATION LIST PRIOR TO VISIT: ?Current Meds  ?Medication Sig  ? acetaminophen (TYLENOL) 325 MG tablet Take 650 mg by mouth every 6 (six) hours as needed for moderate pain.  ? aspirin EC 81 MG tablet Take 1 tablet (81 mg total) by mouth daily. Swallow whole.  ? atorvastatin (LIPITOR) 20 MG tablet Take 1 tablet (20 mg total) by mouth at bedtime.  ? B Complex Vitamins (VITAMIN B COMPLEX) TABS Take 2 tablets by mouth daily.  ? Cholecalciferol (VITAMIN D3) 50 MCG (2000 UT) TABS Take 2,000 Units by mouth daily.  ? Echinacea 125 MG CAPS Take 125 mg by mouth daily.  ? FLUoxetine (PROZAC) 10 MG capsule Take 10 mg by mouth daily.  ? lisinopril (ZESTRIL) 20 MG tablet Take 20 mg by mouth daily.  ? metoprolol succinate (TOPROL-XL) 25 MG 24 hr tablet Take 1 tablet (25 mg total) by mouth daily for 14 days.  ? Misc Natural Products (GLUCOSAMINE CHOND DOUBLE STR) TABS Take 2 tablets by mouth daily.  ? Multiple Vitamins-Minerals (HAIR SKIN AND NAILS FORMULA) TABS Take 2 tablets by mouth daily.  ? Omega 3 1200 MG CAPS Take 1,200 mg by mouth daily.  ? pantoprazole (PROTONIX) 40 MG tablet Take 40 mg by mouth daily as needed (indigestion, heartburn).  ?  ? ?PAST MEDICAL HISTORY: ?Past Medical  History:  ?Diagnosis Date  ? Depression   ? GERD (gastroesophageal reflux disease)   ? History of stomach ulcers   ? Hypertension   ? Hypothyroidism   ? Premature ventricular contractions   ? ? ?PAST SURGICAL HISTORY: ?Past Surgical History:  ?Procedure Laterality Date  ? CHOLECYSTECTOMY    ? TUBAL LIGATION    ? ? ?FAMILY HISTORY: ?The patient family history includes Stroke in her father. ? ?SOCIAL  HISTORY:  ?The patient  reports that she has never smoked. She has never used smokeless tobacco. She reports that she does not currently use alcohol. She reports that she does not use drugs. ? ?REVIEW OF SYSTEMS: ?Review of Systems  ?Cardiovascular:  Positive for palpitations. Negative for chest pain, claudication, dyspnea on exertion, leg swelling, near-syncope, orthopnea, paroxysmal nocturnal dyspnea and syncope.  ?Respiratory:  Negative for shortness of breath.   ?Musculoskeletal:  Positive for arthritis and back pain.  ? ?PHYSICAL EXAM: ? ?  09/20/2021  ? 11:19 AM 09/20/2021  ? 11:13 AM 09/03/2021  ? 10:31 AM  ?Vitals with BMI  ?Height  5\' 5"    ?Weight  175 lbs   ?BMI  29.12   ?Systolic 130 150 [id number]  ?Diastolic 76 82 81  ?Pulse 72 66 76  ? ? ?CONSTITUTIONAL: Well-developed and well-nourished. No acute distress.  ?SKIN: Skin is warm and dry. No rash noted. No cyanosis. No pallor. No jaundice ?HEAD: Normocephalic and atraumatic.  ?EYES: No scleral icterus ?MOUTH/THROAT: Moist oral membranes.  ?NECK: No JVD present. No thyromegaly noted. No carotid bruits  ?LYMPHATIC: No visible cervical adenopathy.  ?CHEST Normal respiratory effort. No intercostal retractions  ?LUNGS: Clear to auscultation bilaterally.  No stridor. No wheezes. No rales.  ?CARDIOVASCULAR: Regular rate and rhythm, positive S1-S2, no murmurs rubs or gallops appreciated. ?ABDOMINAL: Soft, nontender, nondistended, positive bowel sounds in all 4 quadrants no apparent ascites.  ?EXTREMITIES: No peripheral edema, warm to touch, faint bilateral DP and PT pulses ?HEMATOLOGIC: No significant bruising ?NEUROLOGIC: Oriented to person, place, and time. Nonfocal. Normal muscle tone.  ?PSYCHIATRIC: Normal mood and affect. Normal behavior. Cooperative ? ?CARDIAC DATABASE: ?EKG: ?09/03/2021: NSR, 78 bpm, incomplete right bundle branch block, occasional PVCs. ?09/20/2021: NSR, 72 bpm, without underlying ischemia or injury pattern, rare PVC.  ? ?Echocardiogram: ?No results  found for this or any previous visit from the past 1095 days. ?  ? ?Stress Testing: ?No results found for this or any previous visit from the past 1095 days. ? ? ?Heart Catheterization: ?None ? ?Calcium Score  ?08/11/2021: ?LM 0 ?RCA 0 ?LAD 198 ?LCX 310 ?Total: 508 ?IMPRESSION: ?Coronary calcium score of 508. This was 4791 st percentile for age and sex matched control. ? ?LABORATORY DATA: ? ?  Latest Ref Rng & Units 09/03/2021  ?  7:41 AM 07/02/2020  ? 10:57 AM 07/21/2019  ? 12:41 PM  ?CBC  ?WBC 4.0 - 10.5 K/uL 4.5   5.1   5.2    ?Hemoglobin 12.0 - 15.0 g/dL 29.514.9   28.415.2   13.215.8    ?Hematocrit 36.0 - 46.0 % 44.8   45.8   48.2    ?Platelets 150 - 400 K/uL 157   165   175    ? ? ? ?  Latest Ref Rng & Units 09/03/2021  ?  7:41 AM 07/02/2020  ? 10:57 AM 07/21/2019  ? 12:41 PM  ?CMP  ?Glucose 70 - 99 mg/dL 440119   102124   725132    ?BUN 8 - 23 mg/dL  10   16   17     ?Creatinine 0.44 - 1.00 mg/dL   3.55   7.32    ?Sodium 135 - 145 mmol/L 141   140   141    ?Potassium 3.5 - 5.1 mmol/L 3.9   3.5   4.2    ?Chloride 98 - 111 mmol/L 106   103   105    ?CO2 22 - 32 mmol/L 25   25   26     ?Calcium 8.9 - 10.3 mg/dL 9.4   9.3   9.6    ?Total Protein 6.5 - 8.1 g/dL 6.9   7.0   7.2    ?Total Bilirubin 0.3 - 1.2 mg/dL 0.5   0.6   0.9    ?Alkaline Phos 38 - 126 U/L 58   62   76    ?AST 15 - 41 U/L 28   22   25     ?ALT 0 - 44 U/L 17   17   23     ? ? ?Lipid Panel  ?No results found for: CHOL, TRIG, HDL, CHOLHDL, VLDL, LDLCALC, LDLDIRECT, LABVLDL ? ?No components found for: NTPROBNP ?No results for input(s): PROBNP in the last 8760 hours. ?Recent Labs  ?  09/03/21 ?0741  ?TSH 7.323*  ? ? ?BMP ?Recent Labs  ?  09/03/21 ?0741  ?NA 141  ?K 3.9  ?CL 106  ?CO2 25  ?GLUCOSE 119*  ?BUN 10  ?CREATININE 0.93  ?CALCIUM 9.4  ?GFRNONAA >60  ? ? ?HEMOGLOBIN A1C ?No results found for: HGBA1C, MPG ? ?External Labs: ?Collected: 09/08/2021. ?Hemoglobin 14.1 g/dL, hematocrit . ?TSH 2.09. ? ?External Labs: ?Collected: 07/25/2021. ?Hemoglobin A1c 5.7 BUN 16, creatinine  0.86. ?Sodium 141, potassium 3.8, chloride 107, bicarb 29 ?AST 17, ALT 13, alkaline phosphatase 64. ?Total cholesterol 231, triglycerides 183, HDL 50, LDL 148, non-HDL 181 ? ? ?IMPRESSION: ? ?  ICD-10-CM   ?1.

## 2021-09-27 ENCOUNTER — Ambulatory Visit: Payer: Medicare Other

## 2021-09-27 DIAGNOSIS — R931 Abnormal findings on diagnostic imaging of heart and coronary circulation: Secondary | ICD-10-CM

## 2021-09-27 DIAGNOSIS — I2510 Atherosclerotic heart disease of native coronary artery without angina pectoris: Secondary | ICD-10-CM

## 2021-10-02 ENCOUNTER — Other Ambulatory Visit: Payer: Self-pay

## 2021-10-02 DIAGNOSIS — I2510 Atherosclerotic heart disease of native coronary artery without angina pectoris: Secondary | ICD-10-CM

## 2021-10-02 DIAGNOSIS — R931 Abnormal findings on diagnostic imaging of heart and coronary circulation: Secondary | ICD-10-CM

## 2021-10-02 DIAGNOSIS — E7800 Pure hypercholesterolemia, unspecified: Secondary | ICD-10-CM

## 2021-10-05 NOTE — Progress Notes (Signed)
Called pt, no answer. Left VM requesting call back.

## 2021-10-05 NOTE — Progress Notes (Signed)
Pt called back. Pt aware.

## 2021-10-16 ENCOUNTER — Ambulatory Visit: Payer: Medicare Other

## 2021-10-16 DIAGNOSIS — R931 Abnormal findings on diagnostic imaging of heart and coronary circulation: Secondary | ICD-10-CM

## 2021-10-16 DIAGNOSIS — I2510 Atherosclerotic heart disease of native coronary artery without angina pectoris: Secondary | ICD-10-CM

## 2021-10-20 NOTE — Progress Notes (Signed)
Tried calling patient no answer left a vm

## 2021-11-01 LAB — CMP14+EGFR
ALT: 22 IU/L (ref 0–32)
AST: 19 IU/L (ref 0–40)
Albumin/Globulin Ratio: 2 (ref 1.2–2.2)
Albumin: 4.5 g/dL (ref 3.7–4.7)
Alkaline Phosphatase: 89 IU/L (ref 44–121)
BUN/Creatinine Ratio: 15 (ref 12–28)
BUN: 14 mg/dL (ref 8–27)
Bilirubin Total: 0.5 mg/dL (ref 0.0–1.2)
CO2: 24 mmol/L (ref 20–29)
Calcium: 9.5 mg/dL (ref 8.7–10.3)
Chloride: 107 mmol/L — ABNORMAL HIGH (ref 96–106)
Creatinine, Ser: 0.94 mg/dL (ref 0.57–1.00)
Globulin, Total: 2.2 g/dL (ref 1.5–4.5)
Glucose: 96 mg/dL (ref 70–99)
Potassium: 4 mmol/L (ref 3.5–5.2)
Sodium: 147 mmol/L — ABNORMAL HIGH (ref 134–144)
Total Protein: 6.7 g/dL (ref 6.0–8.5)
eGFR: 65 mL/min/{1.73_m2} (ref >59)

## 2021-11-01 LAB — LDL CHOLESTEROL, DIRECT: LDL Direct: 77 mg/dL (ref 0–99)

## 2021-11-01 LAB — LIPID PANEL WITH LDL/HDL RATIO
Cholesterol, Total: 145 mg/dL (ref 100–199)
HDL: 59 mg/dL (ref >39)
LDL Chol Calc (NIH): 71 mg/dL (ref 0–99)
LDL/HDL Ratio: 1.2 ratio (ref 0.0–3.2)
Triglycerides: 76 mg/dL (ref 0–149)
VLDL Cholesterol Cal: 15 mg/dL (ref 5–40)

## 2021-11-01 LAB — LIPOPROTEIN A (LPA): Lipoprotein (a): 318.9 nmol/L — ABNORMAL HIGH (ref <75.0)

## 2021-11-03 NOTE — Progress Notes (Signed)
Called pt to inform her about her lab results. Pt understood

## 2021-11-09 ENCOUNTER — Ambulatory Visit: Payer: Medicare Other | Admitting: Cardiology

## 2021-11-09 ENCOUNTER — Encounter: Payer: Self-pay | Admitting: Cardiology

## 2021-11-09 VITALS — BP 122/73 | HR 73 | Temp 97.3°F | Resp 17 | Ht 65.0 in | Wt 174.4 lb

## 2021-11-09 DIAGNOSIS — R931 Abnormal findings on diagnostic imaging of heart and coronary circulation: Secondary | ICD-10-CM

## 2021-11-09 DIAGNOSIS — I2510 Atherosclerotic heart disease of native coronary artery without angina pectoris: Secondary | ICD-10-CM

## 2021-11-09 DIAGNOSIS — E039 Hypothyroidism, unspecified: Secondary | ICD-10-CM

## 2021-11-09 DIAGNOSIS — E7800 Pure hypercholesterolemia, unspecified: Secondary | ICD-10-CM

## 2021-11-09 DIAGNOSIS — I10 Essential (primary) hypertension: Secondary | ICD-10-CM

## 2021-11-09 DIAGNOSIS — I493 Ventricular premature depolarization: Secondary | ICD-10-CM

## 2021-11-09 MED ORDER — DILTIAZEM HCL ER COATED BEADS 120 MG PO CP24: 120.0000 mg | ORAL_CAPSULE | Freq: Every day | ORAL | 0 refills | Status: DC

## 2021-11-09 NOTE — Progress Notes (Signed)
? ?Date:  11/09/2021  ? ?ID:  Karlton Lemon Tunison, DOB Nov 17, 1950, MRN [id number] ? ?PCP:  Shon Hale, MD  ?Cardiologist:  Tessa Lerner, DO, [facility] (established care 09/20/2021) ? ?Date: 11/09/21 ?Last Office Visit: 09/20/2021 ? ?Chief Complaint  ?Patient presents with  ? Follow-up  ?  7 WEEKS  ? Palpitations  ? HLD  ? Results  ? ? ?HPI  ?Aren Cherne Daponte is a 71 y.o. Caucasian female whose past medical history and cardiovascular risk factors include: Benign essential hypertension, hyperlipidemia, Severe coronary artery calcification, hypothyroidism, history of stomach ulcers, PVCs, postmenopausal female, advanced age. ? ?She is referred to the office at the request of Shon Hale, * for evaluation of premature ventricular contractions. ? ?After going to [facility] emergency room department in March 2023 patient was referred to the practice for evaluation of palpitations/PVCs.  She underwent an extended Holter monitor noted to have a PVC burden of approximately 8%.  These PVCs are predominantly isolated beats and multifocal.  Since initiation of AV nodal blocking agents patient states that the symptoms have improved. ? ?In addition she was noted to have severe coronary artery calcification and was referred to the practice.  She was started on aspirin and cholesterol medication.  Her LDL levels have improved significantly from 148 mg/dL to 77 mg/dL.  Echocardiogram and nuclear stress test results reviewed with her in great detail and noted below for further reference.  On repeat lipid profile and I also checked LP(a) which is elevated. ? ?Clinically patient states that she is feeling better, feels less tired and fatigued, is able to sleep better, and more active. ? ?FUNCTIONAL STATUS: ?Goes out for a walk every other day for 1 hour. ? ?ALLERGIES: ?Allergies  ?Allergen Reactions  ? Codeine Nausea And Vomiting  ? ? ?MEDICATION LIST PRIOR TO VISIT: ?Current Meds  ?Medication Sig  ? acetaminophen (TYLENOL)  325 MG tablet Take 650 mg by mouth every 6 (six) hours as needed for moderate pain.  ? aspirin EC 81 MG tablet Take 1 tablet (81 mg total) by mouth daily. Swallow whole.  ? atorvastatin (LIPITOR) 20 MG tablet Take 1 tablet (20 mg total) by mouth at bedtime.  ? B Complex Vitamins (VITAMIN B COMPLEX) TABS Take 2 tablets by mouth daily.  ? Cholecalciferol (VITAMIN D3) 50 MCG (2000 UT) TABS Take 2,000 Units by mouth daily.  ? diltiazem (CARDIZEM CD) 120 MG 24 hr capsule Take 1 capsule (120 mg total) by mouth daily.  ? Echinacea 125 MG CAPS Take 125 mg by mouth daily.  ? FLUoxetine (PROZAC) 10 MG capsule Take 10 mg by mouth daily.  ? lisinopril (ZESTRIL) 20 MG tablet Take 20 mg by mouth daily.  ? Misc Natural Products (GLUCOSAMINE CHOND DOUBLE STR) TABS Take 2 tablets by mouth daily.  ? Multiple Vitamins-Minerals (HAIR SKIN AND NAILS FORMULA) TABS Take 2 tablets by mouth daily.  ? Omega 3 1200 MG CAPS Take 1,200 mg by mouth daily.  ? pantoprazole (PROTONIX) 40 MG tablet Take 40 mg by mouth daily as needed (indigestion, heartburn).  ? [DISCONTINUED] metoprolol succinate (TOPROL-XL) 25 MG 24 hr tablet Take 1 tablet (25 mg total) by mouth daily for 14 days.  ?  ? ?PAST MEDICAL HISTORY: ?Past Medical History:  ?Diagnosis Date  ? Depression   ? GERD (gastroesophageal reflux disease)   ? History of stomach ulcers   ? Hypertension   ? Hypothyroidism   ? Premature ventricular contractions   ? ? ?PAST SURGICAL  HISTORY: ?Past Surgical History:  ?Procedure Laterality Date  ? CHOLECYSTECTOMY    ? TUBAL LIGATION    ? ? ?FAMILY HISTORY: ?The patient family history includes Stroke in her father. ? ?SOCIAL HISTORY:  ?The patient  reports that she has never smoked. She has never used smokeless tobacco. She reports that she does not currently use alcohol. She reports that she does not use drugs. ? ?REVIEW OF SYSTEMS: ?Review of Systems  ?Cardiovascular:  Positive for palpitations (Much improved). Negative for chest pain, claudication,  dyspnea on exertion, leg swelling, near-syncope, orthopnea, paroxysmal nocturnal dyspnea and syncope.  ?Respiratory:  Negative for shortness of breath.   ?Musculoskeletal:  Positive for arthritis and back pain.  ? ?PHYSICAL EXAM: ? ?  11/09/2021  ?  1:59 PM 09/20/2021  ? 11:19 AM 09/20/2021  ? 11:13 AM  ?Vitals with BMI  ?Height 5\' 5"   5\' 5"   ?Weight 174 lbs 6 oz  175 lbs  ?BMI 29.02  29.12  ?Systolic 122 130  ?Diastolic 73 76 82  ?Pulse 73 72 66  ? ? ?CONSTITUTIONAL: Well-developed and well-nourished. No acute distress.  ?SKIN: Skin is warm and dry. No rash noted. No cyanosis. No pallor. No jaundice ?HEAD: Normocephalic and atraumatic.  ?EYES: No scleral icterus ?MOUTH/THROAT: Moist oral membranes.  ?NECK: No JVD present. No thyromegaly noted. No carotid bruits  ?LYMPHATIC: No visible cervical adenopathy.  ?CHEST Normal respiratory effort. No intercostal retractions  ?LUNGS: Clear to auscultation bilaterally.  No stridor. No wheezes. No rales.  ?CARDIOVASCULAR: Regular rate and rhythm, positive S1-S2, no murmurs rubs or gallops appreciated. ?ABDOMINAL: Soft, nontender, nondistended, positive bowel sounds in all 4 quadrants no apparent ascites.  ?EXTREMITIES: No peripheral edema, warm to touch, faint bilateral DP and PT pulses ?HEMATOLOGIC: No significant bruising ?NEUROLOGIC: Oriented to person, place, and time. Nonfocal. Normal muscle tone.  ?PSYCHIATRIC: Normal mood and affect. Normal behavior. Cooperative ? ?CARDIAC DATABASE: ?EKG: ?09/03/2021: NSR, 78 bpm, incomplete right bundle branch block, occasional PVCs. ?09/20/2021: NSR, 72 bpm, without underlying ischemia or injury pattern, rare PVC.  ? ?Echocardiogram: ?09/27/2021: ?Normal LV systolic function with visual EF 60-65%. Left ventricle cavity is normal in size. Normal left ventricular wall thickness. Normal global wall motion. Normal diastolic filling pattern, normal LAP.  ?Trace aortic regurgitation. ?Trace mitral regurgitation.  ?Mild tricuspid  regurgitation. No evidence of pulmonary hypertension. ?No prior study for comparison.  ? ?Stress Testing: ?Exercise Tetrofosmin Stress Test 10/16/2021: ?Exercise Nuclear Stress Test ?1 Day Rest/Stress protocol.  ?Exercise time 4 minutes, Bruce protocol, achieved 4.64 METS, 91%APMHR.  ?Stress EKG negative for ischemia - PVCs present at rest and recovery only.  ?Overall normal myocardial perfusion with medium size, mild intensity, fixed perfusion defect involving the inferoseptal and apical septal segments likely due to soft tissue attenuation/RV insertion. No obvious evidence of reversible ischemia or prior infarct. ?Left ventricular size is normal, wall motion preserved, no high segmental wall motion abnormalities, gated LVEF 75%. ?Low risk study. ? ?Heart Catheterization: ?None ? ?Calcium Score  ?08/11/2021: ?LM 0 ?RCA 0 ?LAD 198 ?LCX 310 ?Total: 508 ?IMPRESSION: ?Coronary calcium score of 508. This was 22 st percentile for age and sex matched control. ? ?Cardiac monitor (Zio Patch): ?Patch Wear Time: 3 days and 19 hours  ?Dominant rhythm normal sinus. ?Heart rate 47-193 bpm.  Avg HR 70 bpm. ?No atrial fibrillation, ventricular tachycardia, high grade AV block, pauses (3 seconds or longer). ?Total ventricular ectopic burden 8.1% (predominantly isolated beats). ?Total supraventricular ectopic burden <1%. ?Episodes of paroxysmal  supraventricular tachycardia, longest SVT episode 42 beats, 17.8 seconds, on 09/20/2021, max heart rate 156 bpm and average heart rate 144 bpm. ?Patient triggered events: 0.  ? ?LABORATORY DATA: ? ?  Latest Ref Rng & Units 09/03/2021  ?  7:41 AM 07/02/2020  ? 10:57 AM 07/21/2019  ? 12:41 PM  ?CBC  ?WBC 4.0 - 10.5 K/uL 4.5   5.1   5.2    ?Hemoglobin 12.0 - 15.0 g/dL 16.114.9   09.615.2   04.515.8    ?Hematocrit 36.0 - 46.0 % 44.8   45.8   48.2    ?Platelets 150 - 400 K/uL 157   165   175    ? ? ? ?  Latest Ref Rng & Units 10/31/2021  ?  8:22 AM 09/03/2021  ?  7:41 AM 07/02/2020  ? 10:57 AM  ?CMP  ?Glucose 70 - 99  mg/dL 96   409119   811124    ?BUN 8 - 27 mg/dL 14   10   16     ?Creatinine 0.57 - 1.00 mg/dL 9.140.94   7.820.93   9.560.90    ?Sodium 134 - 144 mmol/L 147   141   140    ?Potassium 3.5 - 5.2 mmol/L 4.0   3.9   3.5    ?Chloride 96 - 106

## 2021-11-28 ENCOUNTER — Telehealth: Payer: Self-pay | Admitting: Cardiology

## 2021-11-28 NOTE — Telephone Encounter (Signed)
Patient says she was told to call with any updates since starting diltiazem. She says she's doing well on this medication and wanted to share this information with you.

## 2021-11-29 ENCOUNTER — Other Ambulatory Visit: Payer: Self-pay

## 2021-11-29 DIAGNOSIS — E7800 Pure hypercholesterolemia, unspecified: Secondary | ICD-10-CM

## 2021-11-29 NOTE — Telephone Encounter (Signed)
Thank you for the update!

## 2021-12-05 ENCOUNTER — Other Ambulatory Visit: Payer: Self-pay | Admitting: Cardiology

## 2021-12-05 DIAGNOSIS — I493 Ventricular premature depolarization: Secondary | ICD-10-CM

## 2021-12-16 ENCOUNTER — Other Ambulatory Visit: Payer: Self-pay | Admitting: Cardiology

## 2021-12-16 DIAGNOSIS — I2510 Atherosclerotic heart disease of native coronary artery without angina pectoris: Secondary | ICD-10-CM

## 2021-12-16 DIAGNOSIS — R931 Abnormal findings on diagnostic imaging of heart and coronary circulation: Secondary | ICD-10-CM

## 2021-12-16 DIAGNOSIS — E7800 Pure hypercholesterolemia, unspecified: Secondary | ICD-10-CM

## 2022-01-01 ENCOUNTER — Other Ambulatory Visit: Payer: Self-pay | Admitting: Cardiology

## 2022-01-01 DIAGNOSIS — I493 Ventricular premature depolarization: Secondary | ICD-10-CM

## 2022-01-26 ENCOUNTER — Other Ambulatory Visit: Payer: Self-pay | Admitting: Cardiology

## 2022-01-26 DIAGNOSIS — I493 Ventricular premature depolarization: Secondary | ICD-10-CM

## 2022-02-19 ENCOUNTER — Other Ambulatory Visit: Payer: Self-pay | Admitting: Cardiology

## 2022-02-19 DIAGNOSIS — I493 Ventricular premature depolarization: Secondary | ICD-10-CM

## 2022-03-16 ENCOUNTER — Other Ambulatory Visit: Payer: Self-pay | Admitting: Cardiology

## 2022-03-16 DIAGNOSIS — E7800 Pure hypercholesterolemia, unspecified: Secondary | ICD-10-CM

## 2022-03-16 DIAGNOSIS — R931 Abnormal findings on diagnostic imaging of heart and coronary circulation: Secondary | ICD-10-CM

## 2022-03-16 DIAGNOSIS — I2510 Atherosclerotic heart disease of native coronary artery without angina pectoris: Secondary | ICD-10-CM

## 2022-03-17 ENCOUNTER — Other Ambulatory Visit: Payer: Self-pay | Admitting: Cardiology

## 2022-03-17 DIAGNOSIS — I493 Ventricular premature depolarization: Secondary | ICD-10-CM

## 2022-04-13 ENCOUNTER — Other Ambulatory Visit: Payer: Self-pay | Admitting: Cardiology

## 2022-04-13 DIAGNOSIS — I493 Ventricular premature depolarization: Secondary | ICD-10-CM

## 2022-05-11 ENCOUNTER — Other Ambulatory Visit: Payer: Self-pay | Admitting: Cardiology

## 2022-05-11 DIAGNOSIS — E7800 Pure hypercholesterolemia, unspecified: Secondary | ICD-10-CM

## 2022-05-11 DIAGNOSIS — R931 Abnormal findings on diagnostic imaging of heart and coronary circulation: Secondary | ICD-10-CM

## 2022-05-11 DIAGNOSIS — I2510 Atherosclerotic heart disease of native coronary artery without angina pectoris: Secondary | ICD-10-CM

## 2022-05-14 ENCOUNTER — Ambulatory Visit: Payer: Medicaid Other | Admitting: Cardiology

## 2022-06-04 ENCOUNTER — Ambulatory Visit: Payer: Medicaid Other | Admitting: Cardiology

## 2022-06-11 ENCOUNTER — Ambulatory Visit: Payer: Medicare Other | Admitting: Cardiology

## 2022-06-11 ENCOUNTER — Encounter: Payer: Self-pay | Admitting: Cardiology

## 2022-06-11 VITALS — BP 136/79 | HR 79 | Resp 14 | Ht 65.0 in | Wt 174.2 lb

## 2022-06-11 DIAGNOSIS — R931 Abnormal findings on diagnostic imaging of heart and coronary circulation: Secondary | ICD-10-CM

## 2022-06-11 DIAGNOSIS — E7800 Pure hypercholesterolemia, unspecified: Secondary | ICD-10-CM

## 2022-06-11 DIAGNOSIS — I493 Ventricular premature depolarization: Secondary | ICD-10-CM

## 2022-06-11 DIAGNOSIS — I2510 Atherosclerotic heart disease of native coronary artery without angina pectoris: Secondary | ICD-10-CM

## 2022-06-11 DIAGNOSIS — I10 Essential (primary) hypertension: Secondary | ICD-10-CM

## 2022-06-11 MED ORDER — DILTIAZEM HCL ER COATED BEADS 240 MG PO CP24: 240.0000 mg | ORAL_CAPSULE | Freq: Every day | ORAL | 0 refills | Status: DC

## 2022-06-11 NOTE — Progress Notes (Signed)
Date:  06/11/2022   ID:  Natalie Cross, DOB 01/30/1951, MRN [id number]  PCP:  Glenis Smoker, MD  Cardiologist:  Rex Kras, DO, [facility] (established care 09/20/2021)  Date: 06/11/22 Last Office Visit: 11/29/2021  Chief Complaint  Patient presents with   Follow-up    6 month- coronary artery calcification    HPI  Natalie Cross is a 71 y.o. Caucasian female whose past medical history and cardiovascular risk factors include: Benign essential hypertension, hyperlipidemia, Severe coronary artery calcification, hypothyroidism, history of stomach ulcers, PVCs, postmenopausal female, advanced age.  She is referred to the office at the request of Glenis Smoker, * for evaluation of premature ventricular contractions.  After going to [facility] emergency room department in March 2023 patient was referred to the practice for evaluation of palpitations/PVCs.  She underwent an extended Holter monitor noted to have a PVC burden of approximately 8%.  She was noted to have isolated PVCs were multifocal in morphology.  She was initially on Toprol-XL which later got transitioned Cardizem at last office visit. With regards to her severe coronary artery calcifications did undergo ischemic workup as outlined below.  Her index LDL levels 148 mg/dL she was started on pharmacological therapy and her repeat lipids in May 2023 had significantly improved.    She now presents for 13-month follow-up visit.  Denies anginal discomfort or heart failure symptoms.  Overall has improved clinically since establishing care.  EKG today shows sinus rhythm with PVCs and clinically she continues to have palpitations but overall intensity frequency and duration has improved.  She has not had a repeat lipid profile in the recent past despite being on statin medications.   FUNCTIONAL STATUS: Goes out for a walk every other day for 1 hour.  ALLERGIES: Allergies  Allergen Reactions   Codeine Nausea And  Vomiting    MEDICATION LIST PRIOR TO VISIT: Current Meds  Medication Sig   acetaminophen (TYLENOL) 325 MG tablet Take 650 mg by mouth every 6 (six) hours as needed for moderate pain.   aspirin EC 81 MG tablet Take 1 tablet (81 mg total) by mouth daily. Swallow whole.   atorvastatin (LIPITOR) 20 MG tablet TAKE 1 TABLET BY MOUTH EVERYDAY AT BEDTIME   B Complex Vitamins (VITAMIN B COMPLEX) TABS Take 2 tablets by mouth daily.   Cholecalciferol (VITAMIN D3) 50 MCG (2000 UT) TABS Take 2,000 Units by mouth daily.   Echinacea 125 MG CAPS Take 125 mg by mouth daily.   FLUoxetine (PROZAC) 10 MG capsule Take 10 mg by mouth daily.   lisinopril (ZESTRIL) 20 MG tablet Take 20 mg by mouth daily.   Misc Natural Products (GLUCOSAMINE CHOND DOUBLE STR) TABS Take 2 tablets by mouth daily.   Multiple Vitamins-Minerals (HAIR SKIN AND NAILS FORMULA) TABS Take 2 tablets by mouth daily.   Omega 3 1200 MG CAPS Take 1,200 mg by mouth daily.   pantoprazole (PROTONIX) 40 MG tablet Take 40 mg by mouth daily as needed (indigestion, heartburn).   [DISCONTINUED] diltiazem (CARDIZEM CD) 120 MG 24 hr capsule TAKE 1 CAPSULE BY MOUTH EVERY DAY     PAST MEDICAL HISTORY: Past Medical History:  Diagnosis Date   Coronary atherosclerosis due to calcified coronary lesion    Depression    GERD (gastroesophageal reflux disease)    History of stomach ulcers    Hypertension    Hypothyroidism    Premature ventricular contractions     PAST SURGICAL HISTORY: Past Surgical History:  Procedure Laterality  Date   CHOLECYSTECTOMY     TUBAL LIGATION      FAMILY HISTORY: The patient family history includes Heart disease in her father; Heart failure in her father; Lung cancer in her father; Stroke in her father.  SOCIAL HISTORY:  The patient  reports that she has never smoked. She has never used smokeless tobacco. She reports that she does not currently use alcohol. She reports that she does not use drugs.  REVIEW OF  SYSTEMS: Review of Systems  Cardiovascular:  Positive for palpitations (Improved compared to the last visit.). Negative for chest pain, claudication, dyspnea on exertion, leg swelling, near-syncope, orthopnea, paroxysmal nocturnal dyspnea and syncope.  Respiratory:  Negative for shortness of breath.   Musculoskeletal:  Positive for arthritis and back pain.    PHYSICAL EXAM:    06/11/2022    3:32 PM 11/09/2021    1:59 PM 09/20/2021   11:19 AM  Vitals with BMI  Height 5\' 5"  5\' 5"    Weight 174 lbs 3 oz 174 lbs 6 oz   BMI [id number]   Systolic [id number]  Diastolic 79 73 76  Pulse 79 73 72   Physical Exam  Constitutional: No distress.  Age appropriate, hemodynamically stable.   Neck: No JVD present.  Cardiovascular: Normal rate, regular rhythm, S1 normal, S2 normal, intact distal pulses and normal pulses. Exam reveals no gallop, no S3 and no S4.  No murmur heard. Pulmonary/Chest: Effort normal and breath sounds normal. No stridor. She has no wheezes. She has no rales.  Abdominal: Soft. Bowel sounds are normal. She exhibits no distension. There is no abdominal tenderness.  Musculoskeletal:        General: No edema.     Cervical back: Neck supple.  Neurological: She is alert and oriented to person, place, and time. She has intact cranial nerves (2-12).  Skin: Skin is warm and moist.   CARDIAC DATABASE: EKG: 09/03/2021: NSR, 78 bpm, incomplete right bundle branch block, occasional PVCs. 09/20/2021: NSR, 72 bpm, without underlying ischemia or injury pattern, rare PVC.  06/11/2022: Sinus rhythm, 80 bpm, frequent PVCs.  Echocardiogram: 09/27/2021: Normal LV systolic function with visual EF 60-65%. Left ventricle cavity is normal in size. Normal left ventricular wall thickness. Normal global wall motion. Normal diastolic filling pattern, normal LAP.  Trace aortic regurgitation. Trace mitral regurgitation.  Mild tricuspid regurgitation. No evidence of pulmonary hypertension. No prior  study for comparison.   Stress Testing: Exercise Tetrofosmin Stress Test 10/16/2021: Exercise Nuclear Stress Test 1 Day Rest/Stress protocol.  Exercise time 4 minutes, Bruce protocol, achieved 4.64 METS, 91%APMHR.  Stress EKG negative for ischemia - PVCs present at rest and recovery only.  Overall normal myocardial perfusion with medium size, mild intensity, fixed perfusion defect involving the inferoseptal and apical septal segments likely due to soft tissue attenuation/RV insertion. No obvious evidence of reversible ischemia or prior infarct. Left ventricular size is normal, wall motion preserved, no high segmental wall motion abnormalities, gated LVEF 75%. Low risk study.  Heart Catheterization: None  Calcium Score  08/11/2021: LM 0 RCA 0 LAD 198 LCX 310 Total: 508 IMPRESSION: Coronary calcium score of 508. This was 46 st percentile for age and sex matched control.  Cardiac monitor (Zio Patch): Patch Wear Time: 3 days and 19 hours  Dominant rhythm normal sinus. Heart rate 47-193 bpm.  Avg HR 70 bpm. No atrial fibrillation, ventricular tachycardia, high grade AV block, pauses (3 seconds or longer). Total ventricular ectopic burden 8.1% (predominantly isolated beats). Total  supraventricular ectopic burden <1%. Episodes of paroxysmal supraventricular tachycardia, longest SVT episode 42 beats, 17.8 seconds, on 09/20/2021, max heart rate 156 bpm and average heart rate 144 bpm. Patient triggered events: 0.   LABORATORY DATA:    Latest Ref Rng & Units 09/03/2021    7:41 AM 07/02/2020   10:57 AM 07/21/2019   12:41 PM  CBC  WBC 4.0 - 10.5 K/uL 4.5  5.1  5.2   Hemoglobin 12.0 - 15.0 g/dL 14.9  15.2  15.8   Hematocrit 36.0 - 46.0 % 44.8  45.8  48.2   Platelets 150 - 400 K/uL 157  165  175        Latest Ref Rng & Units 10/31/2021    8:22 AM 09/03/2021    7:41 AM 07/02/2020   10:57 AM  CMP  Glucose 70 - 99 mg/dL 96  119  124   BUN 8 - 27 mg/dL 14  10  16    Creatinine 0.57 - 1.00  mg/dL 0.94  0.93  0.90   Sodium 134 - 144 mmol/L 147  141  140   Potassium 3.5 - 5.2 mmol/L 4.0  3.9  3.5   Chloride 96 - 106 mmol/L 107  106  103   CO2 20 - 29 mmol/L 24  25  25    Calcium 8.7 - 10.3 mg/dL 9.5  9.4  9.3   Total Protein 6.0 - 8.5 g/dL 6.7  6.9  7.0   Total Bilirubin 0.0 - 1.2 mg/dL 0.5  0.5  0.6   Alkaline Phos 44 - 121 IU/L 89  58  62   AST 0 - 40 IU/L 19  28  22    ALT 0 - 32 IU/L 22  17  17      Lipid Panel     Component Value Date/Time   CHOL 145 10/31/2021 0822   TRIG 76 10/31/2021 0822   HDL 59 10/31/2021 0822   LDLCALC 71 10/31/2021 0822   LDLDIRECT 77 10/31/2021 0822   LABVLDL 15 10/31/2021 0822    No components found for: "NTPROBNP" No results for input(s): "PROBNP" in the last 8760 hours. Recent Labs    09/03/21 0741  TSH 7.323*    BMP Recent Labs    09/03/21 0741 10/31/21 0822  NA 141 147*  K 3.9 4.0  CL 106 107*  CO2 25 24  GLUCOSE 119* 96  BUN 10 14  CREATININE 0.93 0.94  CALCIUM 9.4 9.5  GFRNONAA >60  --     HEMOGLOBIN A1C No results found for: "HGBA1C", "MPG"  External Labs: Collected: 09/08/2021. Hemoglobin 14.1 g/dL, hematocrit 41.8%. TSH 2.09.  External Labs: Collected: 07/25/2021. Hemoglobin A1c 5.7 BUN 16, creatinine 0.86. Sodium 141, potassium 3.8, chloride 107, bicarb 29 AST 17, ALT 13, alkaline phosphatase 64. Total cholesterol 231, triglycerides 183, HDL 50, LDL 148, non-HDL 181   IMPRESSION:    ICD-10-CM   1. Coronary atherosclerosis due to calcified coronary lesion  I25.10 EKG 12-Lead   I25.84 Lipid Panel With LDL/HDL Ratio    LDL cholesterol, direct    Comp. Metabolic Panel (12)    Apo A1 + B + Ratio    Lipoprotein A (LPA)    2. Agatston CAC score, >400  R93.1 Lipid Panel With LDL/HDL Ratio    LDL cholesterol, direct    Comp. Metabolic Panel (12)    Apo A1 + B + Ratio    Lipoprotein A (LPA)    3. PVC (premature ventricular contraction)  I49.3 diltiazem (  CARDIZEM CD) 240 MG 24 hr capsule    4. Pure  hypercholesterolemia  E78.00 Lipid Panel With LDL/HDL Ratio    LDL cholesterol, direct    Comp. Metabolic Panel (12)    Apo A1 + B + Ratio    Lipoprotein A (LPA)    5. Benign hypertension  I10        RECOMMENDATIONS: Natalie Cross is a 71 y.o. Caucasian female whose past medical history and cardiac risk factors include: Benign essential hypertension, hyperlipidemia, Severe coronary artery calcification, hypothyroidism, history of stomach ulcers, PVCs, postmenopausal female, advanced age.  PVC (premature ventricular contraction) Improving. Less symptomatic. Has done well with transition of medications from Toprol-XL to Cardizem. Since she continues to have palpitations and her EKG notes frequent PVCs that shared decision was to increase the diltiazem to 240 mg p.o. daily.  30 tablets will be provided.  She will give [place] a call back in 30 days to see how she does.  If he does well on the higher dose to be given a 90-day supply otherwise transition back to 120 mg p.o. daily. Patient agreeable with the plan of care.  Coronary atherosclerosis due to calcified coronary lesion Total CAC: 508AU, 91st percentile. Continue aspirin and statin therapy. Prior echo and stress test results reviewed. EKG nonischemic. No additional workup warranted at this time. Will recheck lipids along with LP(a).  If her repeat lipids remain elevated and/or LP(a) may consider PCSK9 inhibitor as an alternative given her severe CAC.  Will await the results of the labs.  Benign hypertension Improving. Medications reconciled. Currently managed by primary care provider.  Pure hypercholesterolemia On index event LDL 148 mg/dL. Labs from May 2023 independently reviewed which notes a direct LDL of 77 mg/dL. Will recheck fasting lipid profile including LP(a) and apolipoprotein's and CMP.   FINAL MEDICATION LIST END OF ENCOUNTER: Meds ordered this encounter  Medications   diltiazem (CARDIZEM CD) 240 MG 24 hr  capsule    Sig: Take 1 capsule (240 mg total) by mouth daily.    Dispense:  30 capsule    Refill:  0    Medications Discontinued During This Encounter  Medication Reason   diltiazem (CARDIZEM CD) 120 MG 24 hr capsule Reorder     Current Outpatient Medications:    acetaminophen (TYLENOL) 325 MG tablet, Take 650 mg by mouth every 6 (six) hours as needed for moderate pain., Disp: , Rfl:    aspirin EC 81 MG tablet, Take 1 tablet (81 mg total) by mouth daily. Swallow whole., Disp: 30 tablet, Rfl: 11   atorvastatin (LIPITOR) 20 MG tablet, TAKE 1 TABLET BY MOUTH EVERYDAY AT BEDTIME, Disp: 90 tablet, Rfl: 0   B Complex Vitamins (VITAMIN B COMPLEX) TABS, Take 2 tablets by mouth daily., Disp: , Rfl:    Cholecalciferol (VITAMIN D3) 50 MCG (2000 UT) TABS, Take 2,000 Units by mouth daily., Disp: , Rfl:    Echinacea 125 MG CAPS, Take 125 mg by mouth daily., Disp: , Rfl:    FLUoxetine (PROZAC) 10 MG capsule, Take 10 mg by mouth daily., Disp: , Rfl:    lisinopril (ZESTRIL) 20 MG tablet, Take 20 mg by mouth daily., Disp: , Rfl:    Misc Natural Products (GLUCOSAMINE CHOND DOUBLE STR) TABS, Take 2 tablets by mouth daily., Disp: , Rfl:    Multiple Vitamins-Minerals (HAIR SKIN AND NAILS FORMULA) TABS, Take 2 tablets by mouth daily., Disp: , Rfl:    Omega 3 1200 MG CAPS, Take 1,200 mg by  mouth daily., Disp: , Rfl:    pantoprazole (PROTONIX) 40 MG tablet, Take 40 mg by mouth daily as needed (indigestion, heartburn)., Disp: , Rfl:    diltiazem (CARDIZEM CD) 240 MG 24 hr capsule, Take 1 capsule (240 mg total) by mouth daily., Disp: 30 capsule, Rfl: 0  Orders Placed This Encounter  Procedures   Lipid Panel With LDL/HDL Ratio   LDL cholesterol, direct   Comp. Metabolic Panel (12)   Apo A1 + B + Ratio   Lipoprotein A (LPA)   EKG 12-Lead    There are no Patient Instructions on file for this visit.   --Continue cardiac medications as reconciled in final medication list. --Return in about 1 year (around  06/12/2023) for Follow up, Coronary artery calcification, PVC. Or sooner if needed. --Continue follow-up with your primary care physician regarding the management of your other chronic comorbid conditions.  Patient's questions and concerns were addressed to her satisfaction. She voices understanding of the instructions provided during this encounter.   This note was created using a voice recognition software as a result there may be grammatical errors inadvertently enclosed that do not reflect the nature of this encounter. Every attempt is made to correct such errors.  Rex Kras, [place], [facility]  Pager: [phone] Office: [phone]

## 2022-07-05 ENCOUNTER — Other Ambulatory Visit: Payer: Self-pay | Admitting: Cardiology

## 2022-07-05 DIAGNOSIS — I493 Ventricular premature depolarization: Secondary | ICD-10-CM

## 2022-08-01 ENCOUNTER — Telehealth: Payer: Self-pay

## 2022-08-01 ENCOUNTER — Other Ambulatory Visit: Payer: Self-pay | Admitting: Cardiology

## 2022-08-01 ENCOUNTER — Encounter: Payer: Self-pay | Admitting: Cardiology

## 2022-08-01 DIAGNOSIS — R931 Abnormal findings on diagnostic imaging of heart and coronary circulation: Secondary | ICD-10-CM

## 2022-08-01 DIAGNOSIS — E7800 Pure hypercholesterolemia, unspecified: Secondary | ICD-10-CM

## 2022-08-01 DIAGNOSIS — I2510 Atherosclerotic heart disease of native coronary artery without angina pectoris: Secondary | ICD-10-CM

## 2022-08-01 NOTE — Telephone Encounter (Signed)
[facility] calling to leave a message for you. Dr. Lindell Noe believes the patients LDL needs to be at or below 70 and is currently 82. She is wondering if you would be okay with increasing her Atorvastatin to 40 mg?  Call back number- [phone]

## 2022-08-02 NOTE — Telephone Encounter (Signed)
Yes, agree with the goal of less than 70mg /dL. If possible 55mg /d/L would be ideal.  The number you provided is it Dr. Rosario Jacks - if so I can return the call.   Gailya Tauer Grayson, DO, [facility]

## 2022-08-03 NOTE — Telephone Encounter (Signed)
Yes sir, that's the number the CMA gave me to return the call.

## 2022-08-08 NOTE — Telephone Encounter (Signed)
I called them back. I must have forgotten to notate it. I will notate it

## 2022-08-08 NOTE — Telephone Encounter (Signed)
Did you call them back or shall I?  Dr. Terri Skains

## 2022-08-21 IMAGING — CT CT CARDIAC CORONARY ARTERY CALCIUM SCORE
2 series · 16 of 20 positions shown, 18 images · non-contrast
Comparison: None.

Addendum:
CLINICAL DATA: Risk stratification

EXAM:
Coronary Calcium Score
TECHNIQUE: The patient was scanned on a Siemens Somatom 64 slice scanner. Axial
non-contrast 3mm slices were carried out through the heart. The data
set was analyzed on a dedicated work station and scored using the
Agatson method.

[Series 3: cascseq 2.0 b35f 70% · axial · 0.36mm/px · z∈[+1332,+1438]mm · 8 of 69 slices shown]
[im 8/69  vessel]
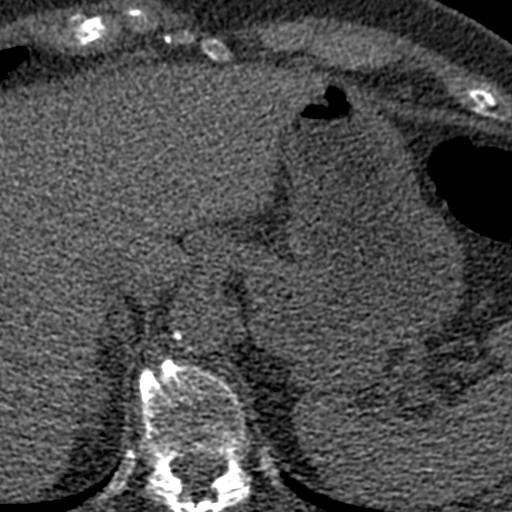
[im 16/69  vessel]
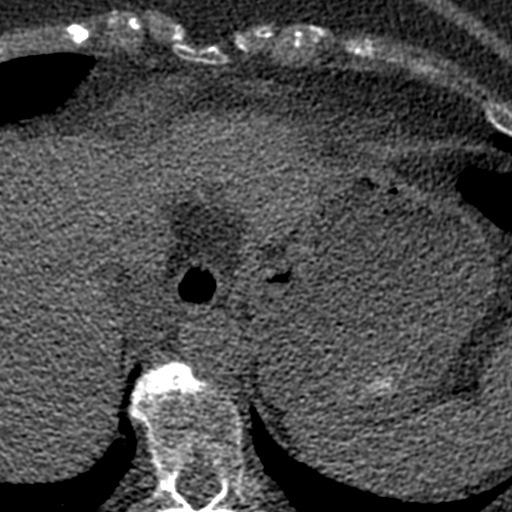
[im 23/69  vessel]
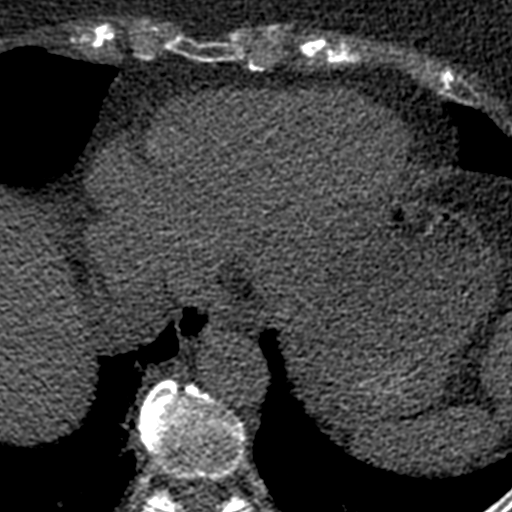
[im 31/69  vessel]
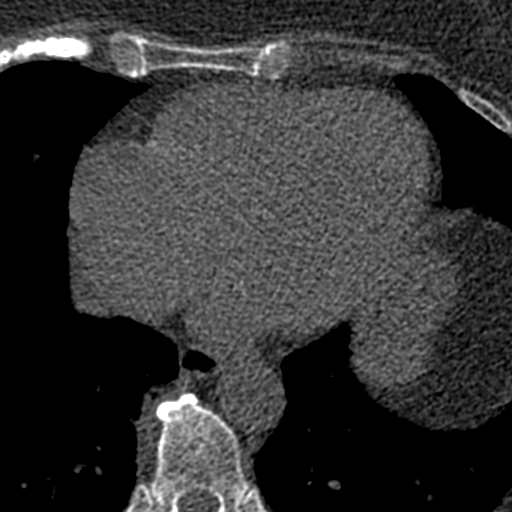
[im 38/69  vessel]
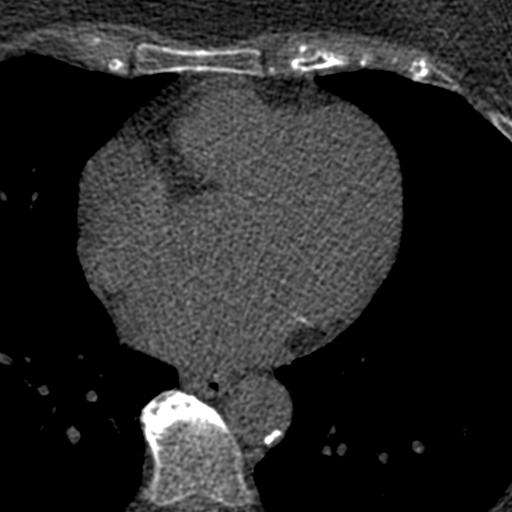
[im 46/69  vessel]
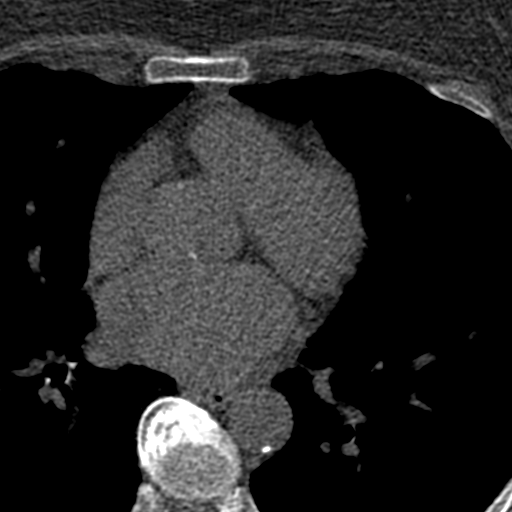
[im 53/69  vessel]
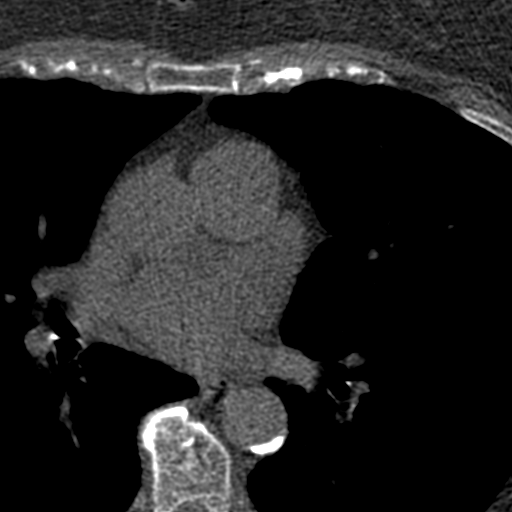
[im 61/69  vessel]
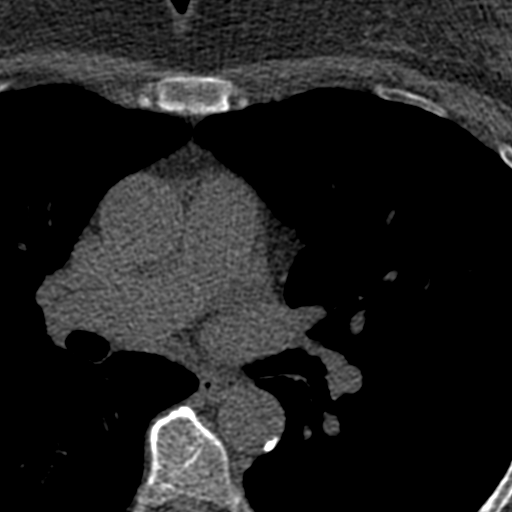

[Series 4: ax st full fov · axial · 0.36mm/px · z∈[+1332,+1438]mm · 8 of 69 slices shown, 10 images]
[im 8/69  vessel]
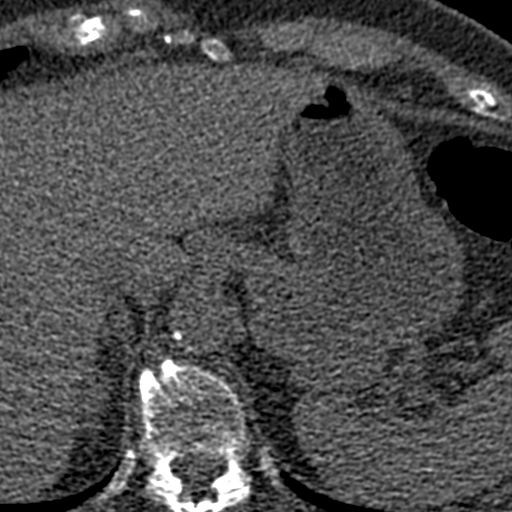
[im 8/69  lung]
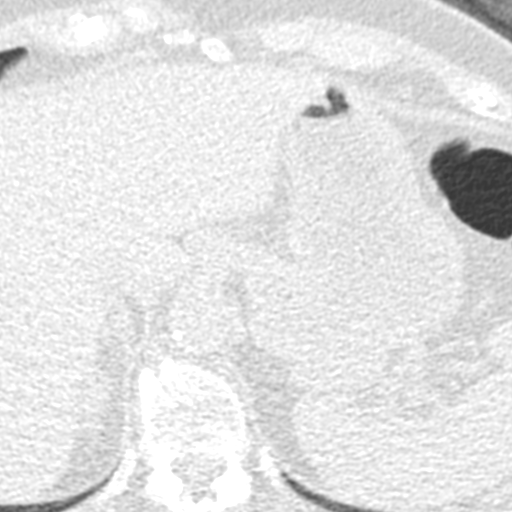
[im 16/69  vessel]
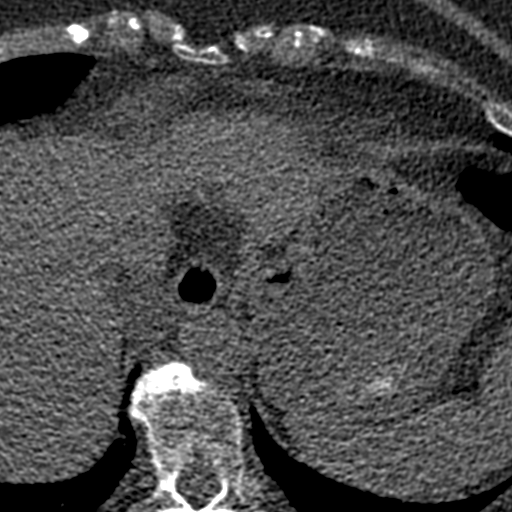
[im 23/69  vessel]
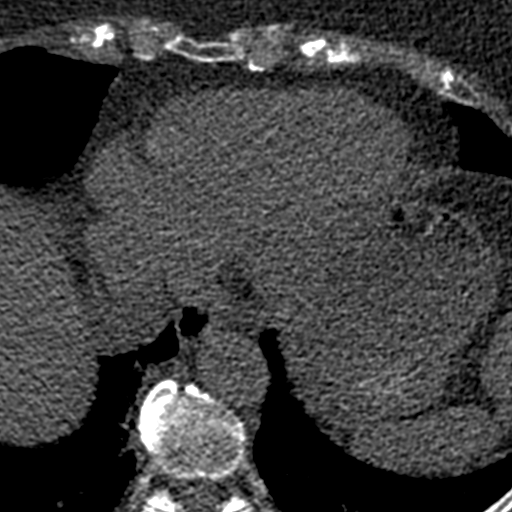
[im 31/69  vessel]
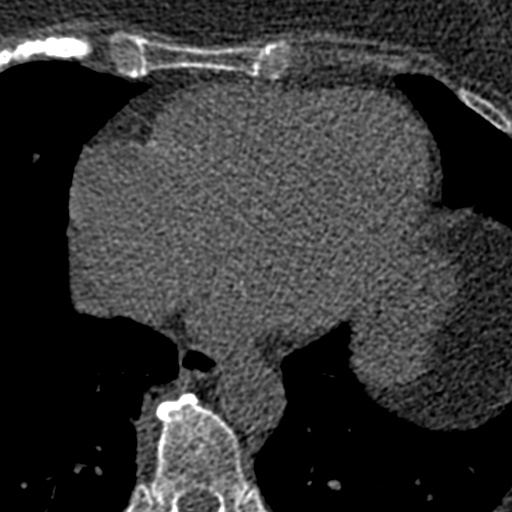
[im 38/69  vessel]
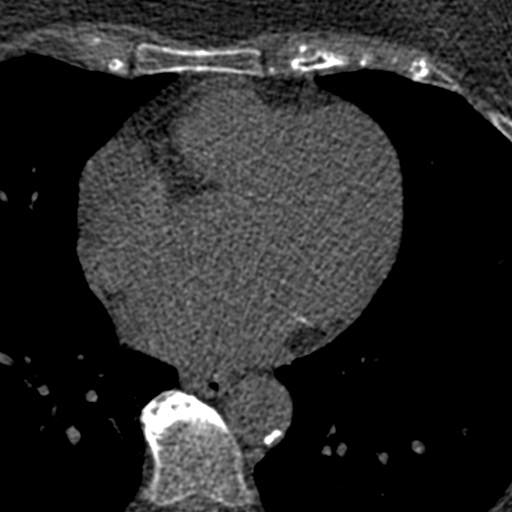
[im 38/69  lung]
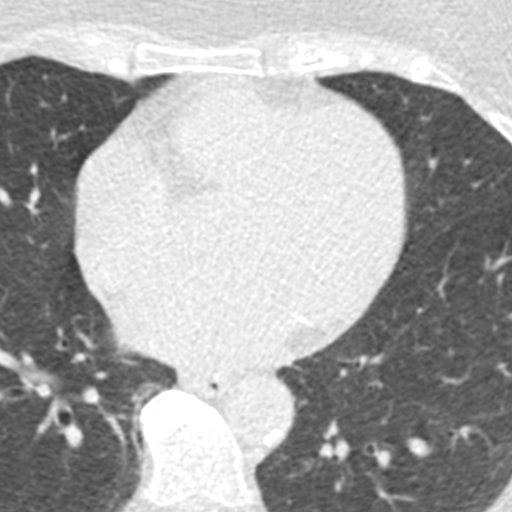
[im 46/69  vessel]
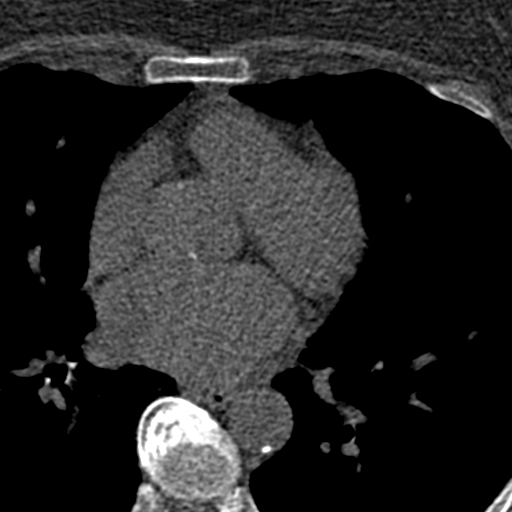
[im 53/69  vessel]
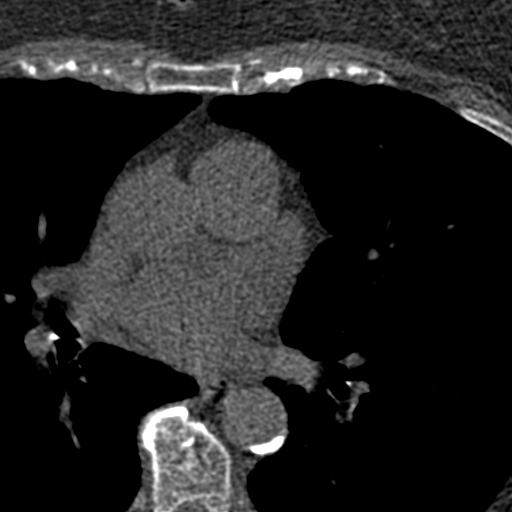
[im 61/69  vessel]
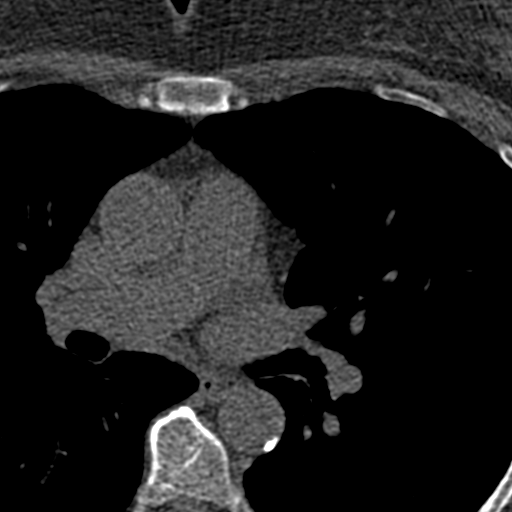

[16 of 20 positions shown; findings below may reference images not displayed]

FINDINGS: Non-cardiac: No significant non cardiac findings on limited lung and
soft tissue windows. See separate report from [REDACTED].

Ascending Aorta: Normal diameter 3.0 cm

Pericardium: Normal

Coronary arteries: Calcium noted in LAD and LCX

LM 0

RCA 0

LAD 198

LCX 310

Total: 508
IMPRESSION: Coronary calcium score of 508. This was 91 st percentile for age and
sex matched control.

Consider f/u perfusion study

[employer]

EXAM:
OVER-READ INTERPRETATION  CT CHEST

The following report is an over-read performed by radiologist Dr.
over-read does not include interpretation of cardiac or coronary
anatomy or pathology. The calcium score interpretation by the
cardiologist is attached.
FINDINGS: Limited view of the lung parenchyma demonstrates no suspicious
nodularity. Airways are normal.

Limited view of the mediastinum demonstrates no adenopathy.
Esophagus normal.

Limited view of the upper abdomen unremarkable.

Limited view of the skeleton and chest wall is unremarkable.
IMPRESSION: No significant extracardiac findings.

*** End of Addendum ***
FINDINGS: Non-cardiac: No significant non cardiac findings on limited lung and
soft tissue windows. See separate report from [REDACTED].

Ascending Aorta: Normal diameter 3.0 cm

Pericardium: Normal

Coronary arteries: Calcium noted in LAD and LCX

LM 0

RCA 0

LAD 198

LCX 310

Total: 508
IMPRESSION: Coronary calcium score of 508. This was 91 st percentile for age and
sex matched control.

Consider f/u perfusion study

[employer]

## 2022-08-23 ENCOUNTER — Encounter: Payer: Self-pay | Admitting: Cardiology

## 2022-08-23 ENCOUNTER — Ambulatory Visit: Payer: Medicare Other | Admitting: Cardiology

## 2022-08-23 VITALS — BP 149/81 | HR 83 | Resp 17 | Ht 65.0 in | Wt 171.8 lb

## 2022-08-23 DIAGNOSIS — I10 Essential (primary) hypertension: Secondary | ICD-10-CM

## 2022-08-23 DIAGNOSIS — I2510 Atherosclerotic heart disease of native coronary artery without angina pectoris: Secondary | ICD-10-CM

## 2022-08-23 DIAGNOSIS — I493 Ventricular premature depolarization: Secondary | ICD-10-CM

## 2022-08-23 DIAGNOSIS — R931 Abnormal findings on diagnostic imaging of heart and coronary circulation: Secondary | ICD-10-CM

## 2022-08-23 DIAGNOSIS — E7800 Pure hypercholesterolemia, unspecified: Secondary | ICD-10-CM

## 2022-08-23 NOTE — Progress Notes (Signed)
Date:  08/23/2022   ID:  Natalie Cross, DOB 12/30/1950, MRN [id number]  PCP:  Glenis Smoker, MD  Cardiologist:  Rex Kras, DO, [facility] (established care 09/20/2021)  Date: 08/23/22 Last Office Visit: 06/11/2022  Chief Complaint  Patient presents with   Follow-up    History of PVCs    HPI  Natalie Cross is a 72 y.o. Caucasian female whose past medical history and cardiovascular risk factors include: Benign essential hypertension, hyperlipidemia, Severe coronary artery calcification, hypothyroidism, history of stomach ulcers, PVCs, postmenopausal female, advanced age.  Patient presents to the office for follow-up for PVCs and coronary artery calcification.  Patient was noted to have a PVC burden of approximately 8%.  Initially was on metoprolol succinate and later transition to Cardizem.  With regards to her severe coronary artery calcification patient did undergo ischemic workup as outlined below.  Her indexed LDL levels 148 mg/dL which have improved with medical therapy.  Patient presents today for follow-up. In December 2023 after Christmas she was having upper respiratory tract infection that lasted for about 1 month but no unifying diagnosis.  Many other family members and friends had similar symptoms.  Since then she has been having symptoms of fast heart rate/gurgling in the chest and abdomen, and " buzzing like sensation through the body."  She has not seen her PCP with regards to the symptoms.  Patient wonders if she is having panic attacks.  At the last office visit her Cardizem was increased to 240 mg p.o. daily and that really helped her symptoms of palpitations.  And since last visit she has been on the higher dose of atorvastatin given her recent lipid panel in January 2024 as directed by PCP and has not had any side effects.   FUNCTIONAL STATUS: Goes out for a walk every other day for 1 hour.  ALLERGIES: Allergies  Allergen Reactions   Codeine Nausea And  Vomiting    MEDICATION LIST PRIOR TO VISIT: Current Meds  Medication Sig   acetaminophen (TYLENOL) 325 MG tablet Take 650 mg by mouth every 6 (six) hours as needed for moderate pain.   aspirin EC 81 MG tablet Take 1 tablet (81 mg total) by mouth daily. Swallow whole.   atorvastatin (LIPITOR) 20 MG tablet TAKE 1 TABLET BY MOUTH EVERYDAY AT BEDTIME (Patient taking differently: Take 40 mg by mouth at bedtime.)   B Complex Vitamins (VITAMIN B COMPLEX) TABS Take 2 tablets by mouth daily.   Cholecalciferol (VITAMIN D3) 50 MCG (2000 UT) TABS Take 2,000 Units by mouth daily.   diltiazem (CARDIZEM CD) 240 MG 24 hr capsule TAKE 1 CAPSULE BY MOUTH EVERY DAY   Echinacea 125 MG CAPS Take 125 mg by mouth daily.   FLUoxetine (PROZAC) 10 MG capsule Take 10 mg by mouth daily.   lisinopril (ZESTRIL) 20 MG tablet Take 20 mg by mouth daily.   Misc Natural Products (GLUCOSAMINE CHOND DOUBLE STR) TABS Take 2 tablets by mouth daily.   Multiple Vitamins-Minerals (HAIR SKIN AND NAILS FORMULA) TABS Take 2 tablets by mouth daily.   Omega 3 1200 MG CAPS Take 1,200 mg by mouth daily.   pantoprazole (PROTONIX) 40 MG tablet Take 40 mg by mouth daily as needed (indigestion, heartburn).     PAST MEDICAL HISTORY: Past Medical History:  Diagnosis Date   Coronary atherosclerosis due to calcified coronary lesion    Depression    GERD (gastroesophageal reflux disease)    History of stomach ulcers    Hypertension  Hypothyroidism    Premature ventricular contractions     PAST SURGICAL HISTORY: Past Surgical History:  Procedure Laterality Date   CHOLECYSTECTOMY     TUBAL LIGATION      FAMILY HISTORY: The patient family history includes Heart disease in her father; Heart failure in her father; Lung cancer in her father; Stroke in her father.  SOCIAL HISTORY:  The patient  reports that she has quit smoking. Her smoking use included cigarettes. She has a 1.25 pack-year smoking history. She has never used smokeless  tobacco. She reports that she does not currently use alcohol. She reports that she does not use drugs.  REVIEW OF SYSTEMS: Review of Systems  Cardiovascular:  Positive for palpitations (Improved compared to the last visit.). Negative for chest pain, claudication, dyspnea on exertion, leg swelling, near-syncope, orthopnea, paroxysmal nocturnal dyspnea and syncope.  Respiratory:  Negative for shortness of breath.   Musculoskeletal:  Positive for arthritis and back pain.    PHYSICAL EXAM:    08/23/2022    1:42 PM 06/11/2022    3:32 PM 11/09/2021    1:59 PM  Vitals with BMI  Height 5' 5"$  5' 5"$  5' 5"$   Weight 171 lbs 13 oz 174 lbs 3 oz 174 lbs 6 oz  BMI 28.59 [id number]  Systolic [id number] [id number]  Diastolic 81 79 73  Pulse 83 79 73   Physical Exam  Constitutional: No distress.  Age appropriate, hemodynamically stable.   Neck: No JVD present.  Cardiovascular: Normal rate, regular rhythm, S1 normal, S2 normal, intact distal pulses and normal pulses. Exam reveals no gallop, no S3 and no S4.  No murmur heard. Pulmonary/Chest: Effort normal and breath sounds normal. No stridor. She has no wheezes. She has no rales.  Abdominal: Soft. Bowel sounds are normal. She exhibits no distension. There is no abdominal tenderness.  Musculoskeletal:        General: No edema.     Cervical back: Neck supple.  Neurological: She is alert and oriented to person, place, and time. She has intact cranial nerves (2-12).  Skin: Skin is warm and moist.   CARDIAC DATABASE: EKG: 09/03/2021: NSR, 78 bpm, incomplete right bundle branch block, occasional PVCs. 09/20/2021: NSR, 72 bpm, without underlying ischemia or injury pattern, rare PVC.  06/11/2022: Sinus rhythm, 80 bpm, frequent PVCs.  Echocardiogram: 09/27/2021: Normal LV systolic function with visual EF 60-65%. Left ventricle cavity is normal in size. Normal left ventricular wall thickness. Normal global wall motion. Normal diastolic filling pattern, normal LAP.   Trace aortic regurgitation. Trace mitral regurgitation.  Mild tricuspid regurgitation. No evidence of pulmonary hypertension. No prior study for comparison.   Stress Testing: Exercise Tetrofosmin Stress Test 10/16/2021: Exercise Nuclear Stress Test 1 Day Rest/Stress protocol.  Exercise time 4 minutes, Bruce protocol, achieved 4.64 METS, 91%APMHR.  Stress EKG negative for ischemia - PVCs present at rest and recovery only.  Overall normal myocardial perfusion with medium size, mild intensity, fixed perfusion defect involving the inferoseptal and apical septal segments likely due to soft tissue attenuation/RV insertion. No obvious evidence of reversible ischemia or prior infarct. Left ventricular size is normal, wall motion preserved, no high segmental wall motion abnormalities, gated LVEF 75%. Low risk study.  Heart Catheterization: None  Calcium Score  08/11/2021: LM 0 RCA 0 LAD 198 LCX 310 Total: 508 IMPRESSION: Coronary calcium score of 508. This was 26 st percentile for age and sex matched control.  Cardiac monitor (Zio Patch): Patch Wear Time: 3 days and 19 hours  Dominant rhythm normal sinus. Heart rate 47-193 bpm.  Avg HR 70 bpm. No atrial fibrillation, ventricular tachycardia, high grade AV block, pauses (3 seconds or longer). Total ventricular ectopic burden 8.1% (predominantly isolated beats). Total supraventricular ectopic burden <1%. Episodes of paroxysmal supraventricular tachycardia, longest SVT episode 42 beats, 17.8 seconds, on 09/20/2021, max heart rate 156 bpm and average heart rate 144 bpm. Patient triggered events: 0.   LABORATORY DATA:    Latest Ref Rng & Units 09/03/2021    7:41 AM 07/02/2020   10:57 AM 07/21/2019   12:41 PM  CBC  WBC 4.0 - 10.5 K/uL 4.5  5.1  5.2   Hemoglobin 12.0 - 15.0 g/dL 14.9  15.2  15.8   Hematocrit 36.0 - 46.0 % 44.8  45.8  48.2   Platelets 150 - 400 K/uL 157  165  175        Latest Ref Rng & Units 10/31/2021    8:22 AM  09/03/2021    7:41 AM 07/02/2020   10:57 AM  CMP  Glucose 70 - 99 mg/dL 96  119  124   BUN 8 - 27 mg/dL 14  10  16   $ Creatinine 0.57 - 1.00 mg/dL 0.94  0.93  0.90   Sodium 134 - 144 mmol/L 147  141  140   Potassium 3.5 - 5.2 mmol/L 4.0  3.9  3.5   Chloride 96 - 106 mmol/L 107  106  103   CO2 20 - 29 mmol/L 24  25  25   $ Calcium 8.7 - 10.3 mg/dL 9.5  9.4  9.3   Total Protein 6.0 - 8.5 g/dL 6.7  6.9  7.0   Total Bilirubin 0.0 - 1.2 mg/dL 0.5  0.5  0.6   Alkaline Phos 44 - 121 IU/L 89  58  62   AST 0 - 40 IU/L 19  28  22   $ ALT 0 - 32 IU/L 22  17  17     $ Lipid Panel     Component Value Date/Time   CHOL 145 10/31/2021 0822   TRIG 76 10/31/2021 0822   HDL 59 10/31/2021 0822   LDLCALC 71 10/31/2021 0822   LDLDIRECT 77 10/31/2021 0822   LABVLDL 15 10/31/2021 0822    No components found for: "NTPROBNP" No results for input(s): "PROBNP" in the last 8760 hours. Recent Labs    09/03/21 0741  TSH 7.323*    BMP Recent Labs    09/03/21 0741 10/31/21 0822  NA 141 147*  K 3.9 4.0  CL 106 107*  CO2 25 24  GLUCOSE 119* 96  BUN 10 14  CREATININE 0.93 0.94  CALCIUM 9.4 9.5  GFRNONAA >60  --     HEMOGLOBIN A1C No results found for: "HGBA1C", "MPG"  External Labs: Collected: 09/08/2021. Hemoglobin 14.1 g/dL, hematocrit 41.8%. TSH 2.09.  External Labs: Collected: 07/25/2021. Hemoglobin A1c 5.7 BUN 16, creatinine 0.86. Sodium 141, potassium 3.8, chloride 107, bicarb 29 AST 17, ALT 13, alkaline phosphatase 64. Total cholesterol 231, triglycerides 183, HDL 50, LDL 148, non-HDL 181  External Labs: Collected: 07/30/2022 Total cholesterol 156, triglycerides 95, HDL 56, non-HDL 100, calculated LDL 82. BUN 19, creatinine 0.89. Sodium 139, potassium 4.2, chloride 105, bicarb 27. AST 24, ALT 21, alkaline phosphatase 75 Hemoglobin 13.8, hematocrit 41.3% A1c 5.8   IMPRESSION:    ICD-10-CM   1. Coronary atherosclerosis due to calcified coronary lesion  I25.10    I25.84     2.  Agatston CAC score, >400  R93.1     3. PVC (premature ventricular contraction)  I49.3     4. Pure hypercholesterolemia  E78.00     5. Benign hypertension  I10        RECOMMENDATIONS: Natalie Cross is a 72 y.o. Caucasian female whose past medical history and cardiac risk factors include: Benign essential hypertension, hyperlipidemia, Severe coronary artery calcification, hypothyroidism, history of stomach ulcers, PVCs, postmenopausal female, advanced age.  PVC (premature ventricular contraction) Improving. Has done well on Cardizem to 40 mg p.o. daily. Will hold off on proceeding with a Zio patch at this time given her other noncardiac symptoms as described above. Once she is back to baseline I would like to repeat a Zio patch to reevaluate her PVC burden and/or dysrhythmias.  Monitor for now  Coronary atherosclerosis due to calcified coronary lesion Total CAC: 508AU, 91st percentile. Continue aspirin and statin therapy. Prior echo and stress test results reviewed. EKG nonischemic. No additional workup warranted at this time. In January 2024 she increased her atorvastatin to 40 mg p.o. nightly as directed by her PCP based on the recent lipid profile. Recommended that she have a fasting lipid profile after being on current dose for 6 weeks to reevaluate therapy  Benign hypertension Improving. Medications reconciled. Currently managed by primary care provider.  Pure hypercholesterolemia On index event LDL 148 mg/dL. Most recent LDL 82 mg/dL January 2024. Currently on atorvastatin 40 mg p.o. nightly Recommend a goal LDL of at least less than 70 mg/dL and if possible around 55 mg/dL.  Her current symptoms of random palpitations/gurgling/buzzing appears to be noncardiac.  I have requested her to follow-up with PCP.  She is requesting medications for panic attacks but I will defer evaluation and management to PCP at this time.  FINAL MEDICATION LIST END OF ENCOUNTER: No orders  of the defined types were placed in this encounter.   There are no discontinued medications.    Current Outpatient Medications:    acetaminophen (TYLENOL) 325 MG tablet, Take 650 mg by mouth every 6 (six) hours as needed for moderate pain., Disp: , Rfl:    aspirin EC 81 MG tablet, Take 1 tablet (81 mg total) by mouth daily. Swallow whole., Disp: 30 tablet, Rfl: 11   atorvastatin (LIPITOR) 20 MG tablet, TAKE 1 TABLET BY MOUTH EVERYDAY AT BEDTIME (Patient taking differently: Take 40 mg by mouth at bedtime.), Disp: 90 tablet, Rfl: 0   B Complex Vitamins (VITAMIN B COMPLEX) TABS, Take 2 tablets by mouth daily., Disp: , Rfl:    Cholecalciferol (VITAMIN D3) 50 MCG (2000 UT) TABS, Take 2,000 Units by mouth daily., Disp: , Rfl:    diltiazem (CARDIZEM CD) 240 MG 24 hr capsule, TAKE 1 CAPSULE BY MOUTH EVERY DAY, Disp: 90 capsule, Rfl: 1   Echinacea 125 MG CAPS, Take 125 mg by mouth daily., Disp: , Rfl:    FLUoxetine (PROZAC) 10 MG capsule, Take 10 mg by mouth daily., Disp: , Rfl:    lisinopril (ZESTRIL) 20 MG tablet, Take 20 mg by mouth daily., Disp: , Rfl:    Misc Natural Products (GLUCOSAMINE CHOND DOUBLE STR) TABS, Take 2 tablets by mouth daily., Disp: , Rfl:    Multiple Vitamins-Minerals (HAIR SKIN AND NAILS FORMULA) TABS, Take 2 tablets by mouth daily., Disp: , Rfl:    Omega 3 1200 MG CAPS, Take 1,200 mg by mouth daily., Disp: , Rfl:    pantoprazole (PROTONIX) 40 MG tablet, Take 40 mg by mouth daily as needed (indigestion, heartburn)., Disp: ,  Rfl:   No orders of the defined types were placed in this encounter.   There are no Patient Instructions on file for this visit.   --Continue cardiac medications as reconciled in final medication list. --Return in about 6 months (around 02/21/2023) for Follow up PVC. Or sooner if needed. --Continue follow-up with your primary care physician regarding the management of your other chronic comorbid conditions.  Patient's questions and concerns were  addressed to her satisfaction. She voices understanding of the instructions provided during this encounter.   This note was created using a voice recognition software as a result there may be grammatical errors inadvertently enclosed that do not reflect the nature of this encounter. Every attempt is made to correct such errors.  Rex Kras, [place], [facility]  Pager: [phone] Office: [phone]

## 2022-08-28 ENCOUNTER — Encounter: Payer: Self-pay | Admitting: *Deleted

## 2022-08-28 DIAGNOSIS — Z006 Encounter for examination for normal comparison and control in clinical research program: Secondary | ICD-10-CM

## 2022-08-28 NOTE — Research (Signed)
Message left on voice mail about V1P research. Encouraged her to call with any questions.

## 2022-09-07 ENCOUNTER — Other Ambulatory Visit: Payer: Self-pay | Admitting: Cardiology

## 2022-09-07 DIAGNOSIS — R931 Abnormal findings on diagnostic imaging of heart and coronary circulation: Secondary | ICD-10-CM

## 2022-09-07 DIAGNOSIS — I2510 Atherosclerotic heart disease of native coronary artery without angina pectoris: Secondary | ICD-10-CM

## 2022-09-13 IMAGING — DX DG CHEST 2V
2 series · 2 of 2 positions shown · non-contrast
Comparison: Portable chest 07/21/2019.

CLINICAL DATA: 70-year-old female with palpitations, tachycardia.

EXAM:
CHEST - 2 VIEW

[chest pa]
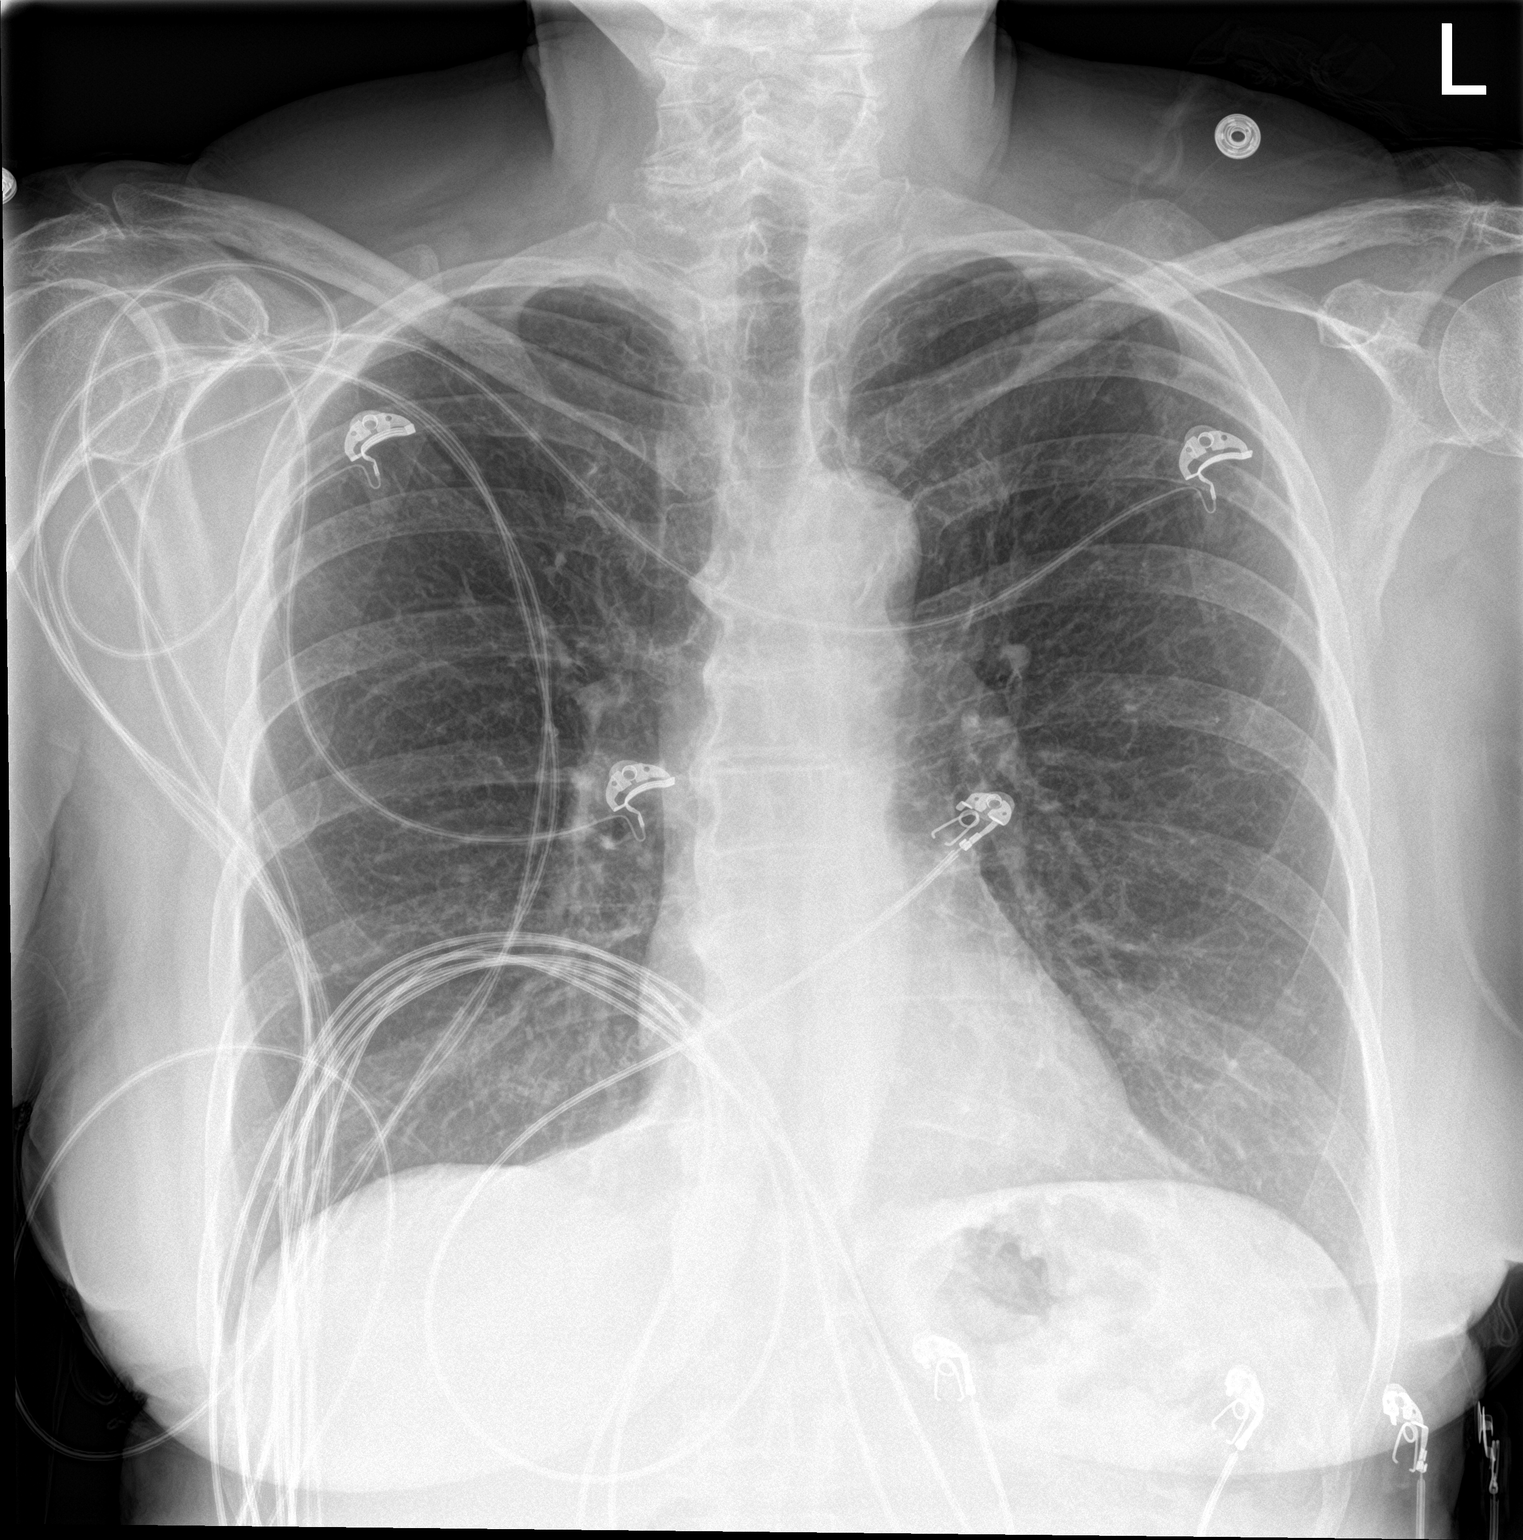

[chest lat]
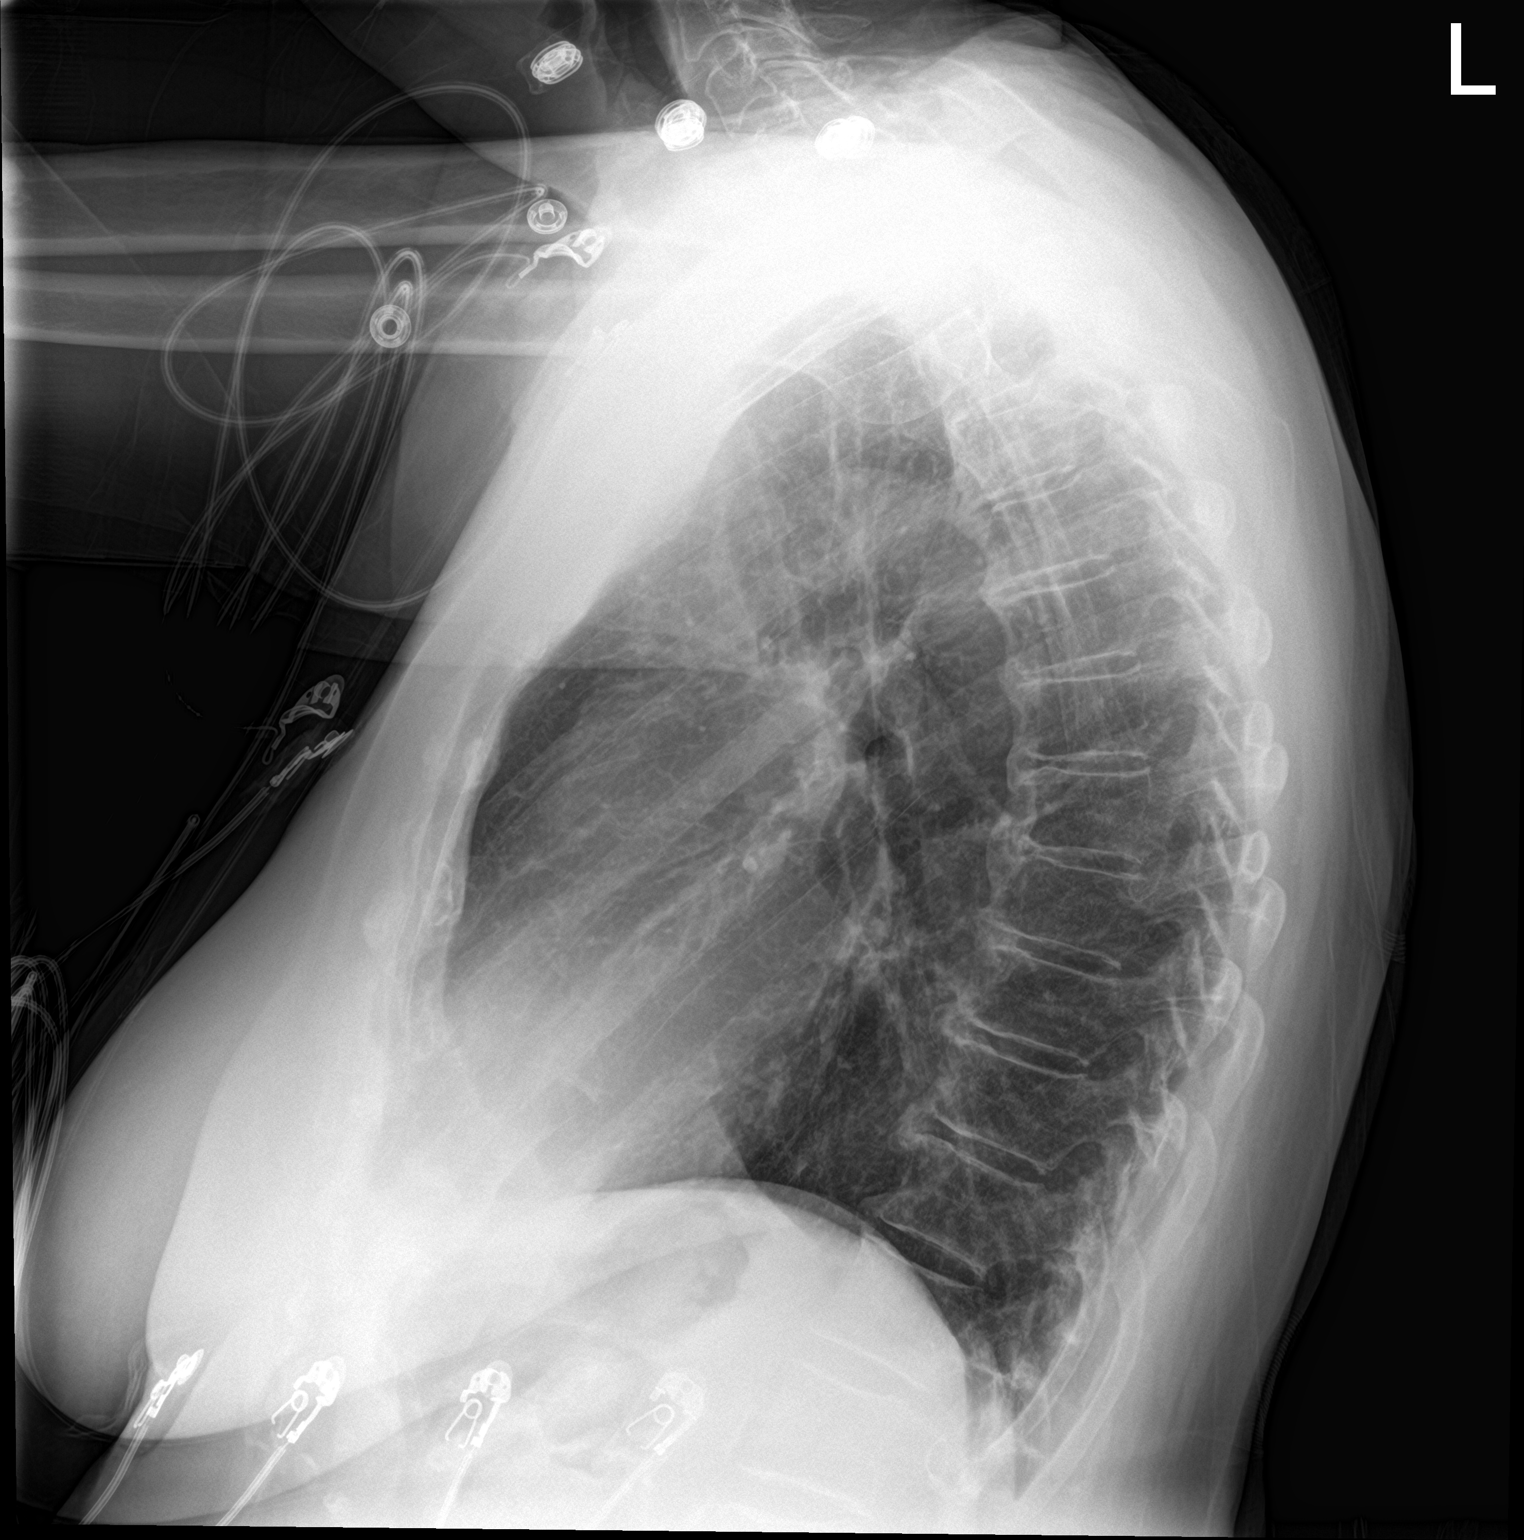

[2 of 2 positions shown; findings below may reference images not displayed]

FINDINGS: PA and lateral views at 9580 hours. Lung volumes and mediastinal
contours are within normal limits. Visualized tracheal air column is
within normal limits. Lung markings appear stable since 1213. No
pneumothorax, pleural effusion or confluent pulmonary opacity. No
acute osseous abnormality identified. Negative visible bowel gas.
IMPRESSION: No acute cardiopulmonary abnormality.

## 2022-12-06 ENCOUNTER — Other Ambulatory Visit: Payer: Self-pay | Admitting: Cardiology

## 2022-12-06 DIAGNOSIS — R931 Abnormal findings on diagnostic imaging of heart and coronary circulation: Secondary | ICD-10-CM

## 2022-12-06 DIAGNOSIS — I2510 Atherosclerotic heart disease of native coronary artery without angina pectoris: Secondary | ICD-10-CM

## 2022-12-06 DIAGNOSIS — E7800 Pure hypercholesterolemia, unspecified: Secondary | ICD-10-CM

## 2023-01-05 ENCOUNTER — Other Ambulatory Visit: Payer: Self-pay | Admitting: Cardiology

## 2023-01-05 DIAGNOSIS — I493 Ventricular premature depolarization: Secondary | ICD-10-CM

## 2023-02-21 ENCOUNTER — Ambulatory Visit: Payer: Medicaid Other | Admitting: Cardiology

## 2023-06-10 ENCOUNTER — Other Ambulatory Visit: Payer: Self-pay | Admitting: Cardiology

## 2023-06-10 DIAGNOSIS — I2510 Atherosclerotic heart disease of native coronary artery without angina pectoris: Secondary | ICD-10-CM

## 2023-06-10 DIAGNOSIS — R931 Abnormal findings on diagnostic imaging of heart and coronary circulation: Secondary | ICD-10-CM

## 2023-06-12 NOTE — Telephone Encounter (Signed)
Pt pharmacy requesting refills Please address thank you

## 2023-06-13 ENCOUNTER — Ambulatory Visit: Payer: Self-pay | Admitting: Cardiology

## 2023-07-06 ENCOUNTER — Other Ambulatory Visit: Payer: Self-pay | Admitting: Cardiology

## 2023-07-06 DIAGNOSIS — I493 Ventricular premature depolarization: Secondary | ICD-10-CM

## 2023-07-23 ENCOUNTER — Other Ambulatory Visit: Payer: Self-pay | Admitting: Family Medicine

## 2023-07-23 DIAGNOSIS — Z0000 Encounter for general adult medical examination without abnormal findings: Secondary | ICD-10-CM

## 2023-07-26 ENCOUNTER — Ambulatory Visit
Admission: RE | Admit: 2023-07-26 | Discharge: 2023-07-26 | Disposition: A | Discharge status: Home / Self Care | Payer: Medicare Other | Source: Ambulatory Visit | Attending: Family Medicine | Admitting: Family Medicine

## 2023-07-26 DIAGNOSIS — Z0000 Encounter for general adult medical examination without abnormal findings: Secondary | ICD-10-CM

## 2023-08-13 ENCOUNTER — Other Ambulatory Visit: Payer: Self-pay | Admitting: Family Medicine

## 2023-08-13 DIAGNOSIS — E2839 Other primary ovarian failure: Secondary | ICD-10-CM

## 2023-09-10 ENCOUNTER — Encounter: Payer: Self-pay | Admitting: Cardiology

## 2023-09-10 ENCOUNTER — Ambulatory Visit: Payer: Medicare Other | Attending: Cardiology | Admitting: Cardiology

## 2023-09-10 VITALS — BP 124/64 | HR 75 | Ht 65.0 in | Wt 178.2 lb

## 2023-09-10 DIAGNOSIS — E7800 Pure hypercholesterolemia, unspecified: Secondary | ICD-10-CM | POA: Insufficient documentation

## 2023-09-10 DIAGNOSIS — I10 Essential (primary) hypertension: Secondary | ICD-10-CM | POA: Insufficient documentation

## 2023-09-10 DIAGNOSIS — I493 Ventricular premature depolarization: Secondary | ICD-10-CM | POA: Insufficient documentation

## 2023-09-10 DIAGNOSIS — I2584 Coronary atherosclerosis due to calcified coronary lesion: Secondary | ICD-10-CM | POA: Insufficient documentation

## 2023-09-10 DIAGNOSIS — I2510 Atherosclerotic heart disease of native coronary artery without angina pectoris: Secondary | ICD-10-CM | POA: Insufficient documentation

## 2023-09-10 DIAGNOSIS — R931 Abnormal findings on diagnostic imaging of heart and coronary circulation: Secondary | ICD-10-CM | POA: Insufficient documentation

## 2023-09-10 MED ORDER — DILTIAZEM HCL ER COATED BEADS 240 MG PO CP24: 240.0000 mg | ORAL_CAPSULE | Freq: Every day | ORAL | 3 refills | Status: AC

## 2023-09-10 NOTE — Patient Instructions (Signed)
 Medication Instructions:  Your physician recommends that you continue on your current medications as directed. Please refer to the Current Medication list given to you today.  Refill for Diltiazem has been sent to your pharmacy.  *If you need a refill on your cardiac medications before your next appointment, please call your pharmacy*  Lab Work: None ordered today. If you have labs (blood work) drawn today and your tests are completely normal, you will receive your results only by: MyChart Message (if you have MyChart) OR A paper copy in the mail If you have any lab test that is abnormal or we need to change your treatment, we will call you to review the results.  Testing/Procedures: None ordered today.  Follow-Up: At [facility], you and your health needs are our priority.  As part of our continuing mission to provide you with exceptional heart care, we have created designated Provider Care Teams.  These Care Teams include your primary Cardiologist (physician) and Advanced Practice Providers (APPs -  Physician Assistants and Nurse Practitioners) who all work together to provide you with the care you need, when you need it.  We recommend signing up for the patient portal called "MyChart".  Sign up information is provided on this After Visit Summary.  MyChart is used to connect with patients for Virtual Visits (Telemedicine).  Patients are able to view lab/test results, encounter notes, upcoming appointments, etc.  Non-urgent messages can be sent to your provider as well.   To learn more about what you can do with MyChart, go to [email or web address].    Your next appointment:   1 year(s)  The format for your next appointment:   In Person  Provider:   Tessa Lerner, DO {

## 2023-09-10 NOTE — Progress Notes (Signed)
 Cardiology Office Note:  .   Date:  09/10/2023  ID:  Natalie Cross, DOB [birth year], MRN [id number] PCP:  Shon Hale, MD  Former Cardiology Providers: N/A [facility] Providers Cardiologist:  Tessa Lerner, DO , [facility] (established care 09/20/2021) Electrophysiologist:  None  Click to update primary MD,subspecialty MD or APP then REFRESH:1}    Chief Complaint  Patient presents with   Follow-up    Annual visit-PVC and CAC    History of Present Illness: .   Natalie Cross is a 73 y.o. Caucasian female whose past medical history and cardiovascular risk factors includes: Benign essential hypertension, hyperlipidemia, Severe coronary artery calcification, hypothyroidism, history of stomach ulcers, PVCs, postmenopausal female, advanced age.   Patient is being followed by the practice given her premature ventricular contractions and coronary artery calcification.  In the past her PVC burden was approximately 8%.  Patient was started on pharmacological therapy with metoprolol and later transition to Cardizem.  Given her severe CAC she did undergo ischemic workup as outlined below.  Her index LDL level is 148 mg/dL which have improved with medical therapy.  Patient presents today for 1 year follow-up visit.  Over the last 1 year patient is doing well from a cardiovascular standpoint.  Denies anginal chest pain or heart failure symptoms.  Patient states her overall function capacity is also stable.  No hospitalizations or urgent care visits since last office visit.  The current dose of Cardizem to 40 mg p.o. daily has done well for her and she does not appreciate any palpitations or fluttering in the chest.  Review of Systems: .   Review of Systems  Cardiovascular:  Negative for chest pain, claudication, irregular heartbeat, leg swelling, near-syncope, orthopnea, palpitations, paroxysmal nocturnal dyspnea and syncope.  Respiratory:  Negative for shortness of breath.    Hematologic/Lymphatic: Negative for bleeding problem.    Studies Reviewed:   EKG: EKG Interpretation Date/Time:  Tuesday September 10 2023 10:43:03 EDT Ventricular Rate:  75 PR Interval:  152 QRS Duration:  74 QT Interval:  374 QTC Calculation: 417 R Axis:   18  Text Interpretation: Normal sinus rhythm When compared with ECG of 03-Sep-2021 07:41, Premature ventricular complexes NO LONGER PRESENT Confirmed by Tessa Lerner [phone]) on 09/10/2023 10:53:38 AM  Echocardiogram: September 27, 2021: LVEF 60 to 65%, normal diastolic function, trace AR, trace MR, mild TR, see report for additional details  Stress Testing: Exercise Tetrofosmin Stress Test 10/16/2021: Low risk study  Calcium Score  08/11/2021: LM 0 RCA 0 LAD 198 LCX 310 Total: 508 IMPRESSION: Coronary calcium score of 508. This was 56 st percentile for age and sex matched control.  Cardiac monitor (Zio Patch): Patch Wear Time: 3 days and 19 hours  Dominant rhythm normal sinus. Heart rate 47-193 bpm.  Avg HR 70 bpm. No atrial fibrillation, ventricular tachycardia, high grade AV block, pauses (3 seconds or longer). Total ventricular ectopic burden 8.1% (predominantly isolated beats). Total supraventricular ectopic burden <1%. Episodes of paroxysmal supraventricular tachycardia, longest SVT episode 42 beats, 17.8 seconds, on 09/20/2021, max heart rate 156 bpm and average heart rate 144 bpm. Patient triggered events: 0.   RADIOLOGY: NA  Risk Assessment/Calculations:   N/A   Labs:       Latest Ref Rng & Units 09/03/2021    7:41 AM 07/02/2020   10:57 AM 07/21/2019   12:41 PM  CBC  WBC 4.0 - 10.5 K/uL 4.5  5.1  5.2   Hemoglobin 12.0 - 15.0 g/dL 14.9  15.2  15.8   Hematocrit 36.0 - 46.0 % 44.8  45.8  48.2   Platelets 150 - 400 K/uL 157  165  175        Latest Ref Rng & Units 10/31/2021    8:22 AM 09/03/2021    7:41 AM 07/02/2020   10:57 AM  BMP  Glucose 70 - 99 mg/dL 96  161  096   BUN 8 - 27 mg/dL 14  10  16     Creatinine 0.57 - 1.00 mg/dL 0.45  4.09  8.11   BUN/Creat Ratio 12 - 28 15     Sodium 134 - 144 mmol/L 147  141  140   Potassium 3.5 - 5.2 mmol/L 4.0  3.9  3.5   Chloride 96 - 106 mmol/L 107  106  103   CO2 20 - 29 mmol/L 24  25  25    Calcium 8.7 - 10.3 mg/dL 9.5  9.4  9.3       Latest Ref Rng & Units 10/31/2021    8:22 AM 09/03/2021    7:41 AM 07/02/2020   10:57 AM  CMP  Glucose 70 - 99 mg/dL 96  914  782   BUN 8 - 27 mg/dL 14  10  16    Creatinine 0.57 - 1.00 mg/dL 9.56  2.13  0.86   Sodium 134 - 144 mmol/L 147  141  140   Potassium 3.5 - 5.2 mmol/L 4.0  3.9  3.5   Chloride 96 - 106 mmol/L 107  106  103   CO2 20 - 29 mmol/L 24  25  25    Calcium 8.7 - 10.3 mg/dL 9.5  9.4  9.3   Total Protein 6.0 - 8.5 g/dL 6.7  6.9  7.0   Total Bilirubin 0.0 - 1.2 mg/dL 0.5  0.5  0.6   Alkaline Phos 44 - 121 IU/L 89  58  62   AST 0 - 40 IU/L 19  28  22    ALT 0 - 32 IU/L 22  17  17      Lab Results  Component Value Date   CHOL 145 10/31/2021   HDL 59 10/31/2021   LDLCALC 71 10/31/2021   LDLDIRECT 77 10/31/2021   TRIG 76 10/31/2021   No results for input(s): "LIPOA" in the last 8760 hours. No components found for: "NTPROBNP" No results for input(s): "PROBNP" in the last 8760 hours. No results for input(s): "TSH" in the last 8760 hours.  External Labs: Collected: 09/08/2021. Hemoglobin 14.1 g/dL, hematocrit 57.8%. TSH 2.09.   External Labs: Collected: 07/25/2021. Hemoglobin A1c 5.7 BUN 16, creatinine 0.86. Sodium 141, potassium 3.8, chloride 107, bicarb 29 AST 17, ALT 13, alkaline phosphatase 64. Total cholesterol 231, triglycerides 183, HDL 50, LDL 148, non-HDL 181   External Labs: Collected: 07/30/2022 Total cholesterol 156, triglycerides 95, HDL 56, non-HDL 100, calculated LDL 82. BUN 19, creatinine 0.89. Sodium 139, potassium 4.2, chloride 105, bicarb 27. AST 24, ALT 21, alkaline phosphatase 75 Hemoglobin 13.8, hematocrit 41.3% A1c 5.8   External Labs: Collected: 08/08/2023 [facility]  database. Total cholesterol 146, triglycerides 122, HDL 56, LDL 69. Hemoglobin 14.3. A1c 5.9. Potassium 4.   Physical Exam:    Today's Vitals   09/10/23 1034  BP: 124/64  Pulse: 75  SpO2: 97%  Weight: 178 lb 3.2 oz (80.8 kg)  Height: 5\' 5"  (1.651 m)   Body mass index is 29.65 kg/m. Wt Readings from Last 3 Encounters:  09/10/23 178 lb 3.2 oz (80.8 kg)  08/23/22 171 lb 12.8 oz (77.9 kg)  06/11/22 174 lb 3.2 oz (79 kg)    Physical Exam  Constitutional: No distress.  hemodynamically stable  Neck: No JVD present.  Cardiovascular: Normal rate, regular rhythm, S1 normal and S2 normal. Exam reveals no gallop, no S3 and no S4.  No murmur heard. Pulmonary/Chest: Effort normal and breath sounds normal. No stridor. She has no wheezes. She has no rales.  Musculoskeletal:        General: No edema.     Cervical back: Neck supple.  Skin: Skin is warm.     Impression & Recommendation(s):  Impression:   ICD-10-CM   1. Coronary atherosclerosis due to calcified coronary lesion  I25.10 EKG 12-Lead   I25.84     2. Agatston CAC score, >400  R93.1     3. PVC (premature ventricular contraction)  I49.3 diltiazem (CARDIZEM CD) 240 MG 24 hr capsule    4. Pure hypercholesterolemia  E78.00     5. Benign hypertension  I10        Recommendation(s):  Coronary atherosclerosis due to calcified coronary lesion Agatston CAC score, >400 Continue aspirin 81 mg p.o. daily. Continue Lipitor 40 mg p.o. nightly. EKG is nonischemic today. Lipids from [facility] database dated 08/08/2023 independently reviewed, LDL is 69 mg/dL, well-controlled. No additional cardiovascular testing is warranted at this time with regards to ischemic evaluation  PVC (premature ventricular contraction) Prior PVC burden approximately 8%. Currently on Cardizem 240 mg p.o. daily. EKG illustrates sinus rhythm without any ventricular ectopic beats. Offered a 3-day Zio patch to reevaluate her PVC burden as she is currently  asymptomatic on current dose of diltiazem.  However, shared decision was to hold off for now she may have to go help her brother and sister-in-law.  Pure hypercholesterolemia Currently on Lipitor 40 mg p.o. daily.   She denies myalgia or other side effects. Cardiology is following peripherally.  Benign hypertension Office blood pressures are well-controlled. Continue lisinopril 20 mg p.o. daily. Reemphasized importance of low-salt diet.   Orders Placed:  Orders Placed This Encounter  Procedures   EKG 12-Lead    Final Medication List:    Meds ordered this encounter  Medications   diltiazem (CARDIZEM CD) 240 MG 24 hr capsule    Sig: Take 1 capsule (240 mg total) by mouth daily.    Dispense:  90 capsule    Refill:  3    Pt must schedule a followup appt with Cardiology in February 2025. [phone]    Medications Discontinued During This Encounter  Medication Reason   atorvastatin (LIPITOR) 20 MG tablet    diltiazem (CARDIZEM CD) 240 MG 24 hr capsule Reorder     Current Outpatient Medications:    acetaminophen (TYLENOL) 325 MG tablet, Take 650 mg by mouth every 6 (six) hours as needed for moderate pain., Disp: , Rfl:    aspirin EC (ASPIRIN LOW DOSE) 81 MG tablet, TAKE 1 TABLET (81 MG TOTAL) BY MOUTH DAILY. SWALLOW WHOLE., Disp: 90 tablet, Rfl: 0   atorvastatin (LIPITOR) 40 MG tablet, 1 tablet Orally Once a day for 90 days, Disp: , Rfl:    B Complex Vitamins (VITAMIN B COMPLEX) TABS, Take 2 tablets by mouth daily., Disp: , Rfl:    Cholecalciferol (VITAMIN D3) 50 MCG (2000 UT) TABS, Take 2,000 Units by mouth daily., Disp: , Rfl:    Echinacea 125 MG CAPS, Take 125 mg by mouth daily., Disp: , Rfl:    FLUoxetine (PROZAC) 10 MG capsule, Take 10  mg by mouth daily., Disp: , Rfl:    lisinopril (ZESTRIL) 20 MG tablet, Take 20 mg by mouth daily., Disp: , Rfl:    Misc Natural Products (GLUCOSAMINE CHOND DOUBLE STR) TABS, Take 2 tablets by mouth daily., Disp: , Rfl:    Multiple  Vitamins-Minerals (HAIR SKIN AND NAILS FORMULA) TABS, Take 2 tablets by mouth daily., Disp: , Rfl:    Multiple Vitamins-Minerals (PRESERVISION AREDS) TABS, 1 tablet Orally once a day, Disp: , Rfl:    Omega 3 1200 MG CAPS, Take 1,200 mg by mouth daily., Disp: , Rfl:    pantoprazole (PROTONIX) 40 MG tablet, Take 40 mg by mouth daily as needed (indigestion, heartburn)., Disp: , Rfl:    diltiazem (CARDIZEM CD) 240 MG 24 hr capsule, Take 1 capsule (240 mg total) by mouth daily., Disp: 90 capsule, Rfl: 3  Consent:   NA  Disposition:   1 year follow-up sooner if needed  Her questions and concerns were addressed to her satisfaction. She voices understanding of the recommendations provided during this encounter.    Signed, Tessa Lerner, DO, [facility]  [facility]  [address] [place], [place] [address] 09/10/2023 11:59 AM

## 2024-04-07 ENCOUNTER — Other Ambulatory Visit: Payer: Medicare Other
